# Patient Record
Sex: Male | Born: 1935 | Race: White | Hispanic: No | Marital: Married
Health system: Southern US, Community
[De-identification: ages and names within clinical notes are randomized; demographics above are authoritative.]

---

## 2000-11-04 ENCOUNTER — Other Ambulatory Visit: Admission: RE | Admit: 2000-11-04 | Discharge: 2000-11-04 | Payer: Self-pay | Admitting: Gastroenterology

## 2004-03-13 ENCOUNTER — Ambulatory Visit (HOSPITAL_COMMUNITY): Admission: RE | Admit: 2004-03-13 | Discharge: 2004-03-14 | Payer: Self-pay | Admitting: Ophthalmology

## 2005-08-06 ENCOUNTER — Other Ambulatory Visit: Payer: Self-pay

## 2005-08-06 ENCOUNTER — Inpatient Hospital Stay: Payer: Self-pay | Admitting: Internal Medicine

## 2007-03-11 ENCOUNTER — Encounter: Payer: Self-pay | Admitting: Internal Medicine

## 2007-04-01 ENCOUNTER — Encounter: Payer: Self-pay | Admitting: Internal Medicine

## 2007-11-17 ENCOUNTER — Ambulatory Visit: Payer: Self-pay | Admitting: Gastroenterology

## 2011-06-06 ENCOUNTER — Inpatient Hospital Stay: Payer: Self-pay | Admitting: Specialist

## 2011-08-21 ENCOUNTER — Ambulatory Visit: Payer: Self-pay | Admitting: Family Medicine

## 2011-09-10 ENCOUNTER — Inpatient Hospital Stay: Payer: Self-pay | Admitting: Internal Medicine

## 2011-09-10 LAB — BASIC METABOLIC PANEL
BUN: 70 mg/dL — ABNORMAL HIGH (ref 7–18)
Calcium, Total: 8.8 mg/dL (ref 8.5–10.1)
Creatinine: 5.03 mg/dL — ABNORMAL HIGH (ref 0.60–1.30)
EGFR (African American): 15 — ABNORMAL LOW
EGFR (Non-African Amer.): 12 — ABNORMAL LOW
Glucose: 43 mg/dL — ABNORMAL LOW (ref 65–99)
Potassium: 4.8 mmol/L (ref 3.5–5.1)
Sodium: 143 mmol/L (ref 136–145)

## 2011-09-10 LAB — CBC
MCV: 89 fL (ref 80–100)
Platelet: 117 10*3/uL — ABNORMAL LOW (ref 150–440)
RBC: 3.02 10*6/uL — ABNORMAL LOW (ref 4.40–5.90)
WBC: 6.3 10*3/uL (ref 3.8–10.6)

## 2011-09-10 LAB — TROPONIN I: Troponin-I: 0.19 ng/mL — ABNORMAL HIGH

## 2011-09-10 LAB — PROTIME-INR: Prothrombin Time: 22.9 secs — ABNORMAL HIGH (ref 11.5–14.7)

## 2011-09-10 LAB — CK TOTAL AND CKMB (NOT AT ARMC)
CK, Total: 291 U/L — ABNORMAL HIGH (ref 35–232)
CK-MB: 6.2 ng/mL — ABNORMAL HIGH (ref 0.5–3.6)

## 2011-09-11 LAB — URINALYSIS, COMPLETE
Bilirubin,UR: NEGATIVE
Glucose,UR: 50 mg/dL (ref 0–75)
Ketone: NEGATIVE
RBC,UR: 19 /HPF (ref 0–5)
Specific Gravity: 1.014 (ref 1.003–1.030)
Squamous Epithelial: NONE SEEN
WBC UR: 7 /HPF (ref 0–5)

## 2011-09-11 LAB — FOLATE: Folic Acid: 31.6 ng/mL (ref 3.1–100.0)

## 2011-09-11 LAB — CK TOTAL AND CKMB (NOT AT ARMC)
CK, Total: 199 U/L (ref 35–232)
CK-MB: 4.1 ng/mL — ABNORMAL HIGH (ref 0.5–3.6)
CK-MB: 5 ng/mL — ABNORMAL HIGH (ref 0.5–3.6)

## 2011-09-11 LAB — CBC WITH DIFFERENTIAL/PLATELET
Basophil %: 0.1 %
Eosinophil %: 3.3 %
HCT: 24.5 % — ABNORMAL LOW (ref 40.0–52.0)
HGB: 7.9 g/dL — ABNORMAL LOW (ref 13.0–18.0)
MCH: 28.9 pg (ref 26.0–34.0)
MCHC: 32.2 g/dL (ref 32.0–36.0)
Neutrophil #: 3.4 10*3/uL (ref 1.4–6.5)
RBC: 2.73 10*6/uL — ABNORMAL LOW (ref 4.40–5.90)

## 2011-09-11 LAB — BASIC METABOLIC PANEL
Anion Gap: 10 (ref 7–16)
Chloride: 110 mmol/L — ABNORMAL HIGH (ref 98–107)
Co2: 24 mmol/L (ref 21–32)
Creatinine: 4.9 mg/dL — ABNORMAL HIGH (ref 0.60–1.30)

## 2011-09-11 LAB — IRON: Iron: 20 ug/dL — ABNORMAL LOW (ref 65–175)

## 2011-09-11 LAB — HEMOGLOBIN A1C: Hemoglobin A1C: 6 % (ref 4.2–6.3)

## 2011-09-11 LAB — TROPONIN I: Troponin-I: 0.16 ng/mL — ABNORMAL HIGH

## 2011-09-11 LAB — FERRITIN: Ferritin (ARMC): 103 ng/mL (ref 8–388)

## 2011-09-12 LAB — BASIC METABOLIC PANEL
Anion Gap: 12 (ref 7–16)
Calcium, Total: 8.4 mg/dL — ABNORMAL LOW (ref 8.5–10.1)
Co2: 23 mmol/L (ref 21–32)
EGFR (African American): 14 — ABNORMAL LOW
EGFR (Non-African Amer.): 12 — ABNORMAL LOW
Sodium: 142 mmol/L (ref 136–145)

## 2011-09-12 LAB — CBC WITH DIFFERENTIAL/PLATELET
Eosinophil %: 4.2 %
Lymphocyte #: 0.7 10*3/uL — ABNORMAL LOW (ref 1.0–3.6)
MCH: 28.4 pg (ref 26.0–34.0)
MCV: 90 fL (ref 80–100)
Neutrophil #: 5 10*3/uL (ref 1.4–6.5)
Platelet: 134 10*3/uL — ABNORMAL LOW (ref 150–440)
RBC: 3.28 10*6/uL — ABNORMAL LOW (ref 4.40–5.90)

## 2011-09-12 LAB — PHOSPHORUS: Phosphorus: 5.7 mg/dL — ABNORMAL HIGH (ref 2.5–4.9)

## 2011-10-21 ENCOUNTER — Ambulatory Visit: Payer: Self-pay | Admitting: Vascular Surgery

## 2011-10-21 LAB — CBC
MCHC: 31.8 g/dL — ABNORMAL LOW (ref 32.0–36.0)
MCV: 88 fL (ref 80–100)
RBC: 2.98 10*6/uL — ABNORMAL LOW (ref 4.40–5.90)

## 2011-10-21 LAB — BASIC METABOLIC PANEL
Anion Gap: 13 (ref 7–16)
BUN: 132 mg/dL — ABNORMAL HIGH (ref 7–18)
Calcium, Total: 9.3 mg/dL (ref 8.5–10.1)
Chloride: 98 mmol/L (ref 98–107)
Co2: 27 mmol/L (ref 21–32)
Creatinine: 9.01 mg/dL — ABNORMAL HIGH (ref 0.60–1.30)
Glucose: 123 mg/dL — ABNORMAL HIGH (ref 65–99)
Osmolality: 320 (ref 275–301)
Potassium: 4.3 mmol/L (ref 3.5–5.1)
Sodium: 138 mmol/L (ref 136–145)

## 2011-10-25 ENCOUNTER — Ambulatory Visit: Payer: Self-pay | Admitting: Vascular Surgery

## 2011-10-25 LAB — PROTIME-INR
INR: 2.1
Prothrombin Time: 23.5 secs — ABNORMAL HIGH (ref 11.5–14.7)

## 2011-11-11 ENCOUNTER — Ambulatory Visit: Payer: Self-pay | Admitting: Oncology

## 2011-11-11 LAB — CBC CANCER CENTER
Basophil %: 1 %
Eosinophil #: 0.1 x10 3/mm (ref 0.0–0.7)
Eosinophil %: 1.3 %
HGB: 7.1 g/dL — ABNORMAL LOW (ref 13.0–18.0)
Lymphocyte %: 7.9 %
MCH: 28.1 pg (ref 26.0–34.0)
MCHC: 30.9 g/dL — ABNORMAL LOW (ref 32.0–36.0)
Monocyte #: 0.5 x10 3/mm (ref 0.2–1.0)
Neutrophil #: 4 x10 3/mm (ref 1.4–6.5)
Neutrophil %: 80.5 %
WBC: 4.9 x10 3/mm (ref 3.8–10.6)

## 2011-11-11 LAB — LACTATE DEHYDROGENASE: LDH: 274 U/L — ABNORMAL HIGH (ref 87–241)

## 2011-11-11 LAB — IRON AND TIBC
Iron Bind.Cap.(Total): 312 ug/dL (ref 250–450)
Iron: 42 ug/dL — ABNORMAL LOW (ref 65–175)
Unbound Iron-Bind.Cap.: 270 ug/dL

## 2011-11-30 ENCOUNTER — Ambulatory Visit: Payer: Self-pay | Admitting: Oncology

## 2011-12-06 ENCOUNTER — Encounter: Payer: Self-pay | Admitting: Cardiothoracic Surgery

## 2011-12-06 ENCOUNTER — Encounter: Payer: Self-pay | Admitting: Nurse Practitioner

## 2011-12-11 LAB — WOUND CULTURE

## 2011-12-30 ENCOUNTER — Encounter: Payer: Self-pay | Admitting: Nurse Practitioner

## 2011-12-30 ENCOUNTER — Encounter: Payer: Self-pay | Admitting: Cardiothoracic Surgery

## 2012-01-29 ENCOUNTER — Emergency Department: Payer: Self-pay

## 2012-01-29 LAB — CBC
MCH: 30.3 pg (ref 26.0–34.0)
MCHC: 33.3 g/dL (ref 32.0–36.0)
Platelet: 168 10*3/uL (ref 150–440)
RBC: 3.46 10*6/uL — ABNORMAL LOW (ref 4.40–5.90)

## 2012-01-29 LAB — URINALYSIS, COMPLETE
Bilirubin,UR: NEGATIVE
Glucose,UR: 50 mg/dL (ref 0–75)
Nitrite: NEGATIVE
Protein: 100
RBC,UR: 16174 /HPF (ref 0–5)
WBC UR: 59 /HPF (ref 0–5)

## 2012-01-29 LAB — BASIC METABOLIC PANEL
Anion Gap: 10 (ref 7–16)
BUN: 81 mg/dL — ABNORMAL HIGH (ref 7–18)
Creatinine: 4.98 mg/dL — ABNORMAL HIGH (ref 0.60–1.30)
EGFR (African American): 12 — ABNORMAL LOW
EGFR (Non-African Amer.): 10 — ABNORMAL LOW
Glucose: 87 mg/dL (ref 65–99)
Sodium: 141 mmol/L (ref 136–145)

## 2012-01-30 ENCOUNTER — Encounter: Payer: Self-pay | Admitting: Nurse Practitioner

## 2012-02-02 LAB — URINE CULTURE

## 2012-02-03 ENCOUNTER — Ambulatory Visit: Payer: Self-pay | Admitting: Oncology

## 2012-02-03 LAB — CANCER CENTER HEMOGLOBIN: HGB: 9.8 g/dL — ABNORMAL LOW (ref 13.0–18.0)

## 2012-02-05 ENCOUNTER — Encounter: Payer: Self-pay | Admitting: Cardiothoracic Surgery

## 2012-03-01 ENCOUNTER — Encounter: Payer: Self-pay | Admitting: Cardiothoracic Surgery

## 2012-03-01 ENCOUNTER — Ambulatory Visit: Payer: Self-pay | Admitting: Oncology

## 2012-03-01 ENCOUNTER — Encounter: Payer: Self-pay | Admitting: Nurse Practitioner

## 2012-05-01 ENCOUNTER — Ambulatory Visit: Payer: Self-pay | Admitting: Gastroenterology

## 2012-05-01 LAB — POTASSIUM: Potassium: 3.2 mmol/L — ABNORMAL LOW (ref 3.5–5.1)

## 2012-05-05 ENCOUNTER — Ambulatory Visit: Payer: Self-pay | Admitting: Specialist

## 2012-05-05 LAB — PATHOLOGY REPORT

## 2012-05-25 ENCOUNTER — Ambulatory Visit: Payer: Self-pay | Admitting: Specialist

## 2012-06-12 ENCOUNTER — Ambulatory Visit: Payer: Self-pay | Admitting: Internal Medicine

## 2012-07-13 ENCOUNTER — Inpatient Hospital Stay: Payer: Self-pay | Admitting: Internal Medicine

## 2012-07-13 LAB — COMPREHENSIVE METABOLIC PANEL
Albumin: 2.8 g/dL — ABNORMAL LOW (ref 3.4–5.0)
Alkaline Phosphatase: 97 U/L (ref 50–136)
Chloride: 94 mmol/L — ABNORMAL LOW (ref 98–107)
Co2: 26 mmol/L (ref 21–32)
EGFR (African American): 5 — ABNORMAL LOW
Glucose: 199 mg/dL — ABNORMAL HIGH (ref 65–99)
SGOT(AST): 12 U/L — ABNORMAL LOW (ref 15–37)
SGPT (ALT): 16 U/L (ref 12–78)
Total Protein: 7.8 g/dL (ref 6.4–8.2)

## 2012-07-13 LAB — CBC
MCHC: 33 g/dL (ref 32.0–36.0)
MCV: 95 fL (ref 80–100)
Platelet: 183 10*3/uL (ref 150–440)
RBC: 3.38 10*6/uL — ABNORMAL LOW (ref 4.40–5.90)
RDW: 18.5 % — ABNORMAL HIGH (ref 11.5–14.5)
WBC: 9.2 10*3/uL (ref 3.8–10.6)

## 2012-07-13 LAB — PRO B NATRIURETIC PEPTIDE: B-Type Natriuretic Peptide: 32467 pg/mL — ABNORMAL HIGH (ref 0–450)

## 2012-07-13 LAB — TROPONIN I: Troponin-I: 0.15 ng/mL — ABNORMAL HIGH

## 2012-07-13 LAB — PROTIME-INR
INR: 2.1
Prothrombin Time: 23.7 secs — ABNORMAL HIGH (ref 11.5–14.7)

## 2012-07-13 LAB — LIPASE, BLOOD: Lipase: 258 U/L (ref 73–393)

## 2012-07-13 LAB — RAPID INFLUENZA A&B ANTIGENS

## 2012-07-14 LAB — BODY FLUID CELL COUNT WITH DIFFERENTIAL
Eosinophil: 0 %
Neutrophils: 98 %
Nucleated Cell Count: 14886 /mm3

## 2012-07-14 LAB — TROPONIN I: Troponin-I: 0.16 ng/mL — ABNORMAL HIGH

## 2012-07-15 LAB — PROTIME-INR: Prothrombin Time: 30.3 secs — ABNORMAL HIGH (ref 11.5–14.7)

## 2012-07-16 LAB — BODY FLUID CELL COUNT WITH DIFFERENTIAL
Basophil: 0 %
Eosinophil: 0 %
Neutrophils: 94 %
Nucleated Cell Count: 1069 /mm3
Other Cells BF: 0 %
Other Mononuclear Cells: 2 %

## 2012-07-16 LAB — VANCOMYCIN, RANDOM: Vancomycin, Random: 11 ug/mL

## 2012-07-16 LAB — PROTIME-INR
INR: 3.3
Prothrombin Time: 33.3 secs — ABNORMAL HIGH (ref 11.5–14.7)

## 2012-07-17 LAB — CBC WITH DIFFERENTIAL/PLATELET
Basophil %: 0.4 %
Eosinophil #: 0.4 10*3/uL (ref 0.0–0.7)
Eosinophil %: 4.6 %
HCT: 27.9 % — ABNORMAL LOW (ref 40.0–52.0)
Lymphocyte %: 4.2 %
MCHC: 33.9 g/dL (ref 32.0–36.0)
Monocyte %: 6.2 %
Platelet: 136 10*3/uL — ABNORMAL LOW (ref 150–440)
RDW: 17.9 % — ABNORMAL HIGH (ref 11.5–14.5)

## 2012-07-17 LAB — PROTIME-INR
INR: 3.6
Prothrombin Time: 35.6 secs — ABNORMAL HIGH (ref 11.5–14.7)

## 2012-07-17 LAB — COMPREHENSIVE METABOLIC PANEL
Albumin: 2 g/dL — ABNORMAL LOW (ref 3.4–5.0)
Alkaline Phosphatase: 105 U/L (ref 50–136)
Chloride: 88 mmol/L — ABNORMAL LOW (ref 98–107)
Co2: 24 mmol/L (ref 21–32)
EGFR (African American): 8 — ABNORMAL LOW
Glucose: 204 mg/dL — ABNORMAL HIGH (ref 65–99)
Osmolality: 282 (ref 275–301)
Potassium: 3.6 mmol/L (ref 3.5–5.1)
SGOT(AST): 17 U/L (ref 15–37)
Sodium: 127 mmol/L — ABNORMAL LOW (ref 136–145)

## 2012-07-18 LAB — CBC WITH DIFFERENTIAL/PLATELET
Basophil #: 0 10*3/uL (ref 0.0–0.1)
Basophil %: 0.3 %
Eosinophil #: 0.3 10*3/uL (ref 0.0–0.7)
Eosinophil %: 3.4 %
Lymphocyte #: 0.2 10*3/uL — ABNORMAL LOW (ref 1.0–3.6)
MCH: 31.9 pg (ref 26.0–34.0)
MCV: 94 fL (ref 80–100)
Monocyte #: 0.4 x10 3/mm (ref 0.2–1.0)
Neutrophil #: 6.7 10*3/uL — ABNORMAL HIGH (ref 1.4–6.5)
Neutrophil %: 88 %
Platelet: 130 10*3/uL — ABNORMAL LOW (ref 150–440)
RDW: 17.7 % — ABNORMAL HIGH (ref 11.5–14.5)
WBC: 7.6 10*3/uL (ref 3.8–10.6)

## 2012-07-18 LAB — COMPREHENSIVE METABOLIC PANEL
Albumin: 1.9 g/dL — ABNORMAL LOW (ref 3.4–5.0)
Alkaline Phosphatase: 108 U/L (ref 50–136)
BUN: 58 mg/dL — ABNORMAL HIGH (ref 7–18)
Bilirubin,Total: 0.6 mg/dL (ref 0.2–1.0)
Calcium, Total: 8.7 mg/dL (ref 8.5–10.1)
Chloride: 91 mmol/L — ABNORMAL LOW (ref 98–107)
Co2: 25 mmol/L (ref 21–32)
EGFR (African American): 8 — ABNORMAL LOW
Glucose: 193 mg/dL — ABNORMAL HIGH (ref 65–99)
Potassium: 3.3 mmol/L — ABNORMAL LOW (ref 3.5–5.1)
SGOT(AST): 16 U/L (ref 15–37)
SGPT (ALT): 17 U/L (ref 12–78)
Sodium: 128 mmol/L — ABNORMAL LOW (ref 136–145)
Total Protein: 6.1 g/dL — ABNORMAL LOW (ref 6.4–8.2)

## 2012-07-18 LAB — PROTIME-INR
INR: 3.6
Prothrombin Time: 35.6 secs — ABNORMAL HIGH (ref 11.5–14.7)

## 2012-07-18 LAB — BODY FLUID CELL COUNT WITH DIFFERENTIAL
Lymphocytes: 6 %
Nucleated Cell Count: 10242 /mm3
Other Cells BF: 0 %
Other Mononuclear Cells: 10 %

## 2012-07-18 LAB — BODY FLUID CULTURE

## 2012-07-19 LAB — PROTIME-INR
INR: 3
Prothrombin Time: 30.9 secs — ABNORMAL HIGH (ref 11.5–14.7)

## 2012-07-19 LAB — CULTURE, BLOOD (SINGLE)

## 2012-07-20 LAB — BASIC METABOLIC PANEL
Anion Gap: 11 (ref 7–16)
Calcium, Total: 8.6 mg/dL (ref 8.5–10.1)
Co2: 26 mmol/L (ref 21–32)
Creatinine: 6.84 mg/dL — ABNORMAL HIGH (ref 0.60–1.30)
EGFR (African American): 8 — ABNORMAL LOW
Glucose: 256 mg/dL — ABNORMAL HIGH (ref 65–99)
Osmolality: 281 (ref 275–301)
Sodium: 128 mmol/L — ABNORMAL LOW (ref 136–145)

## 2012-07-20 LAB — PROTIME-INR: Prothrombin Time: 19.5 secs — ABNORMAL HIGH (ref 11.5–14.7)

## 2012-07-20 LAB — BODY FLUID CULTURE

## 2012-07-21 LAB — CBC WITH DIFFERENTIAL/PLATELET
Basophil %: 0.5 %
Eosinophil %: 3.8 %
HCT: 25.5 % — ABNORMAL LOW (ref 40.0–52.0)
Lymphocyte %: 2.7 %
MCH: 32.2 pg (ref 26.0–34.0)
MCHC: 34.4 g/dL (ref 32.0–36.0)
MCV: 94 fL (ref 80–100)
Monocyte #: 0.7 x10 3/mm (ref 0.2–1.0)
Monocyte %: 7.3 %
Neutrophil #: 8.1 10*3/uL — ABNORMAL HIGH (ref 1.4–6.5)
Platelet: 161 10*3/uL (ref 150–440)
RBC: 2.73 10*6/uL — ABNORMAL LOW (ref 4.40–5.90)
RDW: 17.5 % — ABNORMAL HIGH (ref 11.5–14.5)
WBC: 9.4 10*3/uL (ref 3.8–10.6)

## 2012-07-21 LAB — COMPREHENSIVE METABOLIC PANEL
Albumin: 1.5 g/dL — ABNORMAL LOW (ref 3.4–5.0)
Alkaline Phosphatase: 88 U/L (ref 50–136)
Anion Gap: 8 (ref 7–16)
BUN: 49 mg/dL — ABNORMAL HIGH (ref 7–18)
Bilirubin,Total: 0.4 mg/dL (ref 0.2–1.0)
Chloride: 91 mmol/L — ABNORMAL LOW (ref 98–107)
Co2: 28 mmol/L (ref 21–32)
Creatinine: 6.68 mg/dL — ABNORMAL HIGH (ref 0.60–1.30)
Potassium: 3.5 mmol/L (ref 3.5–5.1)
SGOT(AST): 14 U/L — ABNORMAL LOW (ref 15–37)
SGPT (ALT): 14 U/L (ref 12–78)
Total Protein: 5.2 g/dL — ABNORMAL LOW (ref 6.4–8.2)

## 2012-07-22 LAB — PROTIME-INR
INR: 1.5
Prothrombin Time: 18.1 secs — ABNORMAL HIGH (ref 11.5–14.7)

## 2012-07-23 LAB — RENAL FUNCTION PANEL
Albumin: 1.5 g/dL — ABNORMAL LOW (ref 3.4–5.0)
Anion Gap: 10 (ref 7–16)
BUN: 63 mg/dL — ABNORMAL HIGH (ref 7–18)
Calcium, Total: 8.3 mg/dL — ABNORMAL LOW (ref 8.5–10.1)
Chloride: 91 mmol/L — ABNORMAL LOW (ref 98–107)
Co2: 26 mmol/L (ref 21–32)
Creatinine: 8.76 mg/dL — ABNORMAL HIGH (ref 0.60–1.30)
EGFR (African American): 6 — ABNORMAL LOW
EGFR (Non-African Amer.): 5 — ABNORMAL LOW
Potassium: 4 mmol/L (ref 3.5–5.1)
Sodium: 127 mmol/L — ABNORMAL LOW (ref 136–145)

## 2012-07-23 LAB — PHOSPHORUS: Phosphorus: 5.3 mg/dL — ABNORMAL HIGH (ref 2.5–4.9)

## 2012-07-24 LAB — PROTIME-INR
INR: 1.3
Prothrombin Time: 16.5 secs — ABNORMAL HIGH (ref 11.5–14.7)

## 2012-07-25 LAB — CBC WITH DIFFERENTIAL/PLATELET
Basophil %: 0.6 %
HGB: 7.6 g/dL — ABNORMAL LOW (ref 13.0–18.0)
Lymphocyte #: 0.5 10*3/uL — ABNORMAL LOW (ref 1.0–3.6)
Lymphocyte %: 3.8 %
MCHC: 31.7 g/dL — ABNORMAL LOW (ref 32.0–36.0)
MCV: 95 fL (ref 80–100)
Neutrophil #: 10.5 10*3/uL — ABNORMAL HIGH (ref 1.4–6.5)
Neutrophil %: 81.1 %
Platelet: 188 10*3/uL (ref 150–440)
RDW: 17.2 % — ABNORMAL HIGH (ref 11.5–14.5)
WBC: 12.9 10*3/uL — ABNORMAL HIGH (ref 3.8–10.6)

## 2012-07-25 LAB — COMPREHENSIVE METABOLIC PANEL
Albumin: 1.7 g/dL — ABNORMAL LOW (ref 3.4–5.0)
Anion Gap: 11 (ref 7–16)
Calcium, Total: 8.2 mg/dL — ABNORMAL LOW (ref 8.5–10.1)
Creatinine: 6.74 mg/dL — ABNORMAL HIGH (ref 0.60–1.30)
EGFR (African American): 8 — ABNORMAL LOW
Osmolality: 279 (ref 275–301)
Potassium: 4 mmol/L (ref 3.5–5.1)
SGPT (ALT): 11 U/L — ABNORMAL LOW (ref 12–78)

## 2012-07-25 LAB — PHOSPHORUS: Phosphorus: 3.6 mg/dL (ref 2.5–4.9)

## 2012-07-26 LAB — PROTIME-INR: INR: 1.3

## 2012-07-27 LAB — RENAL FUNCTION PANEL
Anion Gap: 9 (ref 7–16)
Calcium, Total: 8.5 mg/dL (ref 8.5–10.1)
Chloride: 95 mmol/L — ABNORMAL LOW (ref 98–107)
EGFR (African American): 9 — ABNORMAL LOW
EGFR (Non-African Amer.): 8 — ABNORMAL LOW
Glucose: 212 mg/dL — ABNORMAL HIGH (ref 65–99)
Osmolality: 283 (ref 275–301)
Potassium: 4.2 mmol/L (ref 3.5–5.1)

## 2012-07-27 LAB — CBC WITH DIFFERENTIAL/PLATELET
Basophil #: 0.1 10*3/uL (ref 0.0–0.1)
Basophil %: 0.9 %
Eosinophil #: 0.6 10*3/uL (ref 0.0–0.7)
Eosinophil %: 5 %
HGB: 7.4 g/dL — ABNORMAL LOW (ref 13.0–18.0)
Lymphocyte #: 0.5 10*3/uL — ABNORMAL LOW (ref 1.0–3.6)
Lymphocyte %: 4.1 %
MCH: 30.6 pg (ref 26.0–34.0)
MCHC: 31.8 g/dL — ABNORMAL LOW (ref 32.0–36.0)
Monocyte #: 0.8 x10 3/mm (ref 0.2–1.0)
Monocyte %: 6.3 %
Neutrophil #: 10.3 10*3/uL — ABNORMAL HIGH (ref 1.4–6.5)
Neutrophil %: 83.7 %
Platelet: 191 10*3/uL (ref 150–440)
RBC: 2.42 10*6/uL — ABNORMAL LOW (ref 4.40–5.90)

## 2012-07-27 LAB — PROTIME-INR: INR: 1.4

## 2012-07-28 LAB — CULTURE, BLOOD (SINGLE)

## 2012-08-18 ENCOUNTER — Ambulatory Visit: Payer: Self-pay | Admitting: Vascular Surgery

## 2012-08-18 LAB — BASIC METABOLIC PANEL
Anion Gap: 8 (ref 7–16)
BUN: 32 mg/dL — ABNORMAL HIGH (ref 7–18)
Calcium, Total: 9.2 mg/dL (ref 8.5–10.1)
Co2: 30 mmol/L (ref 21–32)
Creatinine: 4.01 mg/dL — ABNORMAL HIGH (ref 0.60–1.30)
EGFR (Non-African Amer.): 13 — ABNORMAL LOW
Glucose: 136 mg/dL — ABNORMAL HIGH (ref 65–99)
Sodium: 141 mmol/L (ref 136–145)

## 2012-08-18 LAB — CBC
HGB: 8.2 g/dL — ABNORMAL LOW (ref 13.0–18.0)
MCH: 29.2 pg (ref 26.0–34.0)
MCHC: 31.2 g/dL — ABNORMAL LOW (ref 32.0–36.0)
MCV: 94 fL (ref 80–100)
RDW: 18.9 % — ABNORMAL HIGH (ref 11.5–14.5)

## 2012-08-21 ENCOUNTER — Encounter: Payer: Self-pay | Admitting: Cardiothoracic Surgery

## 2012-08-21 ENCOUNTER — Encounter: Payer: Self-pay | Admitting: Nurse Practitioner

## 2012-08-27 ENCOUNTER — Ambulatory Visit: Payer: Self-pay | Admitting: Vascular Surgery

## 2012-08-27 LAB — PROTIME-INR
INR: 1.5
Prothrombin Time: 17.8 secs — ABNORMAL HIGH (ref 11.5–14.7)

## 2012-08-29 ENCOUNTER — Encounter: Payer: Self-pay | Admitting: Nurse Practitioner

## 2012-08-29 ENCOUNTER — Encounter: Payer: Self-pay | Admitting: Cardiothoracic Surgery

## 2012-09-15 ENCOUNTER — Ambulatory Visit: Payer: Self-pay | Admitting: Specialist

## 2012-09-24 ENCOUNTER — Ambulatory Visit: Payer: Self-pay | Admitting: Vascular Surgery

## 2012-09-24 LAB — PROTIME-INR: INR: 1.8

## 2012-09-29 ENCOUNTER — Encounter: Payer: Self-pay | Admitting: Nurse Practitioner

## 2012-09-29 ENCOUNTER — Ambulatory Visit: Payer: Self-pay | Admitting: Specialist

## 2012-10-01 ENCOUNTER — Encounter: Payer: Self-pay | Admitting: Cardiothoracic Surgery

## 2012-10-06 ENCOUNTER — Ambulatory Visit: Payer: Self-pay | Admitting: Vascular Surgery

## 2012-10-06 LAB — BASIC METABOLIC PANEL
Anion Gap: 6 — ABNORMAL LOW (ref 7–16)
BUN: 42 mg/dL — ABNORMAL HIGH (ref 7–18)
Calcium, Total: 9.4 mg/dL (ref 8.5–10.1)
Co2: 31 mmol/L (ref 21–32)
EGFR (African American): 15 — ABNORMAL LOW
Glucose: 143 mg/dL — ABNORMAL HIGH (ref 65–99)
Osmolality: 287 (ref 275–301)
Potassium: 3.3 mmol/L — ABNORMAL LOW (ref 3.5–5.1)

## 2012-10-06 LAB — CBC
HCT: 30.7 % — ABNORMAL LOW (ref 40.0–52.0)
HGB: 9.6 g/dL — ABNORMAL LOW (ref 13.0–18.0)
MCV: 87 fL (ref 80–100)
RDW: 20.2 % — ABNORMAL HIGH (ref 11.5–14.5)
WBC: 5.3 10*3/uL (ref 3.8–10.6)

## 2012-10-08 ENCOUNTER — Ambulatory Visit: Payer: Self-pay | Admitting: Vascular Surgery

## 2012-10-08 LAB — PROTIME-INR: Prothrombin Time: 22.6 secs — ABNORMAL HIGH (ref 11.5–14.7)

## 2012-10-08 LAB — POTASSIUM: Potassium: 3.3 mmol/L — ABNORMAL LOW (ref 3.5–5.1)

## 2012-10-15 ENCOUNTER — Ambulatory Visit: Payer: Self-pay | Admitting: Vascular Surgery

## 2012-10-15 LAB — BASIC METABOLIC PANEL
Calcium, Total: 9.4 mg/dL (ref 8.5–10.1)
Chloride: 99 mmol/L (ref 98–107)
Co2: 31 mmol/L (ref 21–32)
Glucose: 94 mg/dL (ref 65–99)
Osmolality: 283 (ref 275–301)

## 2012-10-15 LAB — PROTIME-INR: Prothrombin Time: 19.3 secs — ABNORMAL HIGH (ref 11.5–14.7)

## 2012-10-22 ENCOUNTER — Ambulatory Visit: Payer: Self-pay | Admitting: Vascular Surgery

## 2012-10-22 LAB — POTASSIUM: Potassium: 3.2 mmol/L — ABNORMAL LOW (ref 3.5–5.1)

## 2012-10-22 LAB — PROTIME-INR: INR: 1.7

## 2012-10-29 ENCOUNTER — Encounter: Payer: Self-pay | Admitting: Nurse Practitioner

## 2012-10-29 ENCOUNTER — Ambulatory Visit: Payer: Self-pay | Admitting: Vascular Surgery

## 2012-10-29 LAB — BASIC METABOLIC PANEL
Anion Gap: 7 (ref 7–16)
BUN: 33 mg/dL — ABNORMAL HIGH (ref 7–18)
Calcium, Total: 10 mg/dL (ref 8.5–10.1)
Creatinine: 3.91 mg/dL — ABNORMAL HIGH (ref 0.60–1.30)
EGFR (African American): 16 — ABNORMAL LOW
EGFR (Non-African Amer.): 14 — ABNORMAL LOW
Osmolality: 279 (ref 275–301)
Potassium: 3.7 mmol/L (ref 3.5–5.1)
Sodium: 136 mmol/L (ref 136–145)

## 2012-10-29 LAB — PROTIME-INR: INR: 1.4

## 2012-10-30 LAB — CBC WITH DIFFERENTIAL/PLATELET
Basophil #: 0 10*3/uL (ref 0.0–0.1)
Basophil %: 0.7 %
Eosinophil #: 0.3 10*3/uL (ref 0.0–0.7)
HGB: 8.3 g/dL — ABNORMAL LOW (ref 13.0–18.0)
Lymphocyte #: 0.4 10*3/uL — ABNORMAL LOW (ref 1.0–3.6)
MCH: 27 pg (ref 26.0–34.0)
MCHC: 31.4 g/dL — ABNORMAL LOW (ref 32.0–36.0)
MCV: 86 fL (ref 80–100)
Monocyte #: 0.5 x10 3/mm (ref 0.2–1.0)
Monocyte %: 10.8 %
Neutrophil #: 3.2 10*3/uL (ref 1.4–6.5)
Neutrophil %: 72.4 %
RBC: 3.08 10*6/uL — ABNORMAL LOW (ref 4.40–5.90)
RDW: 20.2 % — ABNORMAL HIGH (ref 11.5–14.5)

## 2012-11-04 ENCOUNTER — Encounter: Payer: Self-pay | Admitting: Cardiothoracic Surgery

## 2012-11-20 ENCOUNTER — Ambulatory Visit: Payer: Self-pay | Admitting: Vascular Surgery

## 2012-11-20 LAB — CBC
MCH: 27 pg (ref 26.0–34.0)
MCHC: 32.2 g/dL (ref 32.0–36.0)
MCV: 84 fL (ref 80–100)
RBC: 3.36 10*6/uL — ABNORMAL LOW (ref 4.40–5.90)
RDW: 19.6 % — ABNORMAL HIGH (ref 11.5–14.5)

## 2012-11-20 LAB — PROTIME-INR
INR: 1.4
Prothrombin Time: 17.2 secs — ABNORMAL HIGH (ref 11.5–14.7)

## 2012-11-20 LAB — BASIC METABOLIC PANEL
Anion Gap: 7 (ref 7–16)
Calcium, Total: 9.5 mg/dL (ref 8.5–10.1)
Chloride: 97 mmol/L — ABNORMAL LOW (ref 98–107)
Co2: 30 mmol/L (ref 21–32)
Creatinine: 3.33 mg/dL — ABNORMAL HIGH (ref 0.60–1.30)
EGFR (African American): 20 — ABNORMAL LOW
EGFR (Non-African Amer.): 17 — ABNORMAL LOW
Osmolality: 278 (ref 275–301)
Potassium: 3.6 mmol/L (ref 3.5–5.1)
Sodium: 134 mmol/L — ABNORMAL LOW (ref 136–145)

## 2012-11-29 ENCOUNTER — Encounter: Payer: Self-pay | Admitting: Nurse Practitioner

## 2012-12-02 ENCOUNTER — Encounter: Payer: Self-pay | Admitting: Cardiothoracic Surgery

## 2012-12-10 ENCOUNTER — Ambulatory Visit: Payer: Self-pay | Admitting: Specialist

## 2012-12-15 ENCOUNTER — Ambulatory Visit: Payer: Self-pay | Admitting: Vascular Surgery

## 2012-12-29 ENCOUNTER — Encounter: Payer: Self-pay | Admitting: Nurse Practitioner

## 2012-12-29 ENCOUNTER — Ambulatory Visit: Payer: Self-pay | Admitting: Vascular Surgery

## 2012-12-30 ENCOUNTER — Encounter: Payer: Self-pay | Admitting: Cardiothoracic Surgery

## 2013-02-02 ENCOUNTER — Ambulatory Visit: Payer: Self-pay | Admitting: Vascular Surgery

## 2013-02-02 LAB — BASIC METABOLIC PANEL
BUN: 61 mg/dL — ABNORMAL HIGH (ref 7–18)
Chloride: 100 mmol/L (ref 98–107)
Co2: 30 mmol/L (ref 21–32)
Creatinine: 4.54 mg/dL — ABNORMAL HIGH (ref 0.60–1.30)
EGFR (African American): 13 — ABNORMAL LOW
EGFR (Non-African Amer.): 12 — ABNORMAL LOW
Glucose: 168 mg/dL — ABNORMAL HIGH (ref 65–99)
Sodium: 136 mmol/L (ref 136–145)

## 2013-02-02 LAB — CBC
HCT: 31.6 % — ABNORMAL LOW (ref 40.0–52.0)
MCH: 28.4 pg (ref 26.0–34.0)
MCHC: 32.4 g/dL (ref 32.0–36.0)
MCV: 88 fL (ref 80–100)
Platelet: 150 10*3/uL (ref 150–440)
RBC: 3.61 10*6/uL — ABNORMAL LOW (ref 4.40–5.90)

## 2013-02-09 ENCOUNTER — Ambulatory Visit: Payer: Self-pay | Admitting: Vascular Surgery

## 2013-02-09 LAB — PROTIME-INR: Prothrombin Time: 18.1 secs — ABNORMAL HIGH (ref 11.5–14.7)

## 2013-02-12 ENCOUNTER — Inpatient Hospital Stay: Payer: Self-pay | Admitting: Vascular Surgery

## 2013-02-12 LAB — PROTIME-INR: Prothrombin Time: 15.5 secs — ABNORMAL HIGH (ref 11.5–14.7)

## 2013-02-12 LAB — POTASSIUM: Potassium: 3.9 mmol/L (ref 3.5–5.1)

## 2013-02-13 LAB — RENAL FUNCTION PANEL
Anion Gap: 11 (ref 7–16)
Calcium, Total: 9.2 mg/dL (ref 8.5–10.1)
Creatinine: 7.24 mg/dL — ABNORMAL HIGH (ref 0.60–1.30)
EGFR (African American): 8 — ABNORMAL LOW
EGFR (Non-African Amer.): 7 — ABNORMAL LOW
Glucose: 135 mg/dL — ABNORMAL HIGH (ref 65–99)
Potassium: 4.2 mmol/L (ref 3.5–5.1)

## 2013-05-31 ENCOUNTER — Inpatient Hospital Stay: Payer: Self-pay | Admitting: Surgery

## 2013-05-31 ENCOUNTER — Ambulatory Visit: Payer: Self-pay | Admitting: Internal Medicine

## 2013-05-31 LAB — COMPREHENSIVE METABOLIC PANEL
Albumin: 2.8 g/dL — ABNORMAL LOW (ref 3.4–5.0)
Alkaline Phosphatase: 131 U/L — ABNORMAL HIGH
Anion Gap: 10 (ref 7–16)
BUN: 65 mg/dL — ABNORMAL HIGH (ref 7–18)
Bilirubin,Total: 0.6 mg/dL (ref 0.2–1.0)
Co2: 27 mmol/L (ref 21–32)
Creatinine: 5.25 mg/dL — ABNORMAL HIGH (ref 0.60–1.30)
EGFR (Non-African Amer.): 10 — ABNORMAL LOW
Osmolality: 283 (ref 275–301)
SGOT(AST): 23 U/L (ref 15–37)
SGPT (ALT): 19 U/L (ref 12–78)
Sodium: 133 mmol/L — ABNORMAL LOW (ref 136–145)
Total Protein: 7.1 g/dL (ref 6.4–8.2)

## 2013-05-31 LAB — PROTIME-INR: Prothrombin Time: 23.4 secs — ABNORMAL HIGH (ref 11.5–14.7)

## 2013-05-31 LAB — CBC
MCV: 85 fL (ref 80–100)
Platelet: 142 10*3/uL — ABNORMAL LOW (ref 150–440)
RDW: 20.4 % — ABNORMAL HIGH (ref 11.5–14.5)

## 2013-05-31 LAB — LIPASE, BLOOD: Lipase: 252 U/L (ref 73–393)

## 2013-06-01 LAB — CBC WITH DIFFERENTIAL/PLATELET
Basophil #: 0 10*3/uL (ref 0.0–0.1)
Basophil %: 0.2 %
Eosinophil #: 0 10*3/uL (ref 0.0–0.7)
HCT: 31.3 % — ABNORMAL LOW (ref 40.0–52.0)
HGB: 10.1 g/dL — ABNORMAL LOW (ref 13.0–18.0)
Lymphocyte %: 3.6 %
MCHC: 32.1 g/dL (ref 32.0–36.0)
Monocyte %: 12.8 %
Platelet: 122 10*3/uL — ABNORMAL LOW (ref 150–440)
RBC: 3.7 10*6/uL — ABNORMAL LOW (ref 4.40–5.90)
RDW: 20.3 % — ABNORMAL HIGH (ref 11.5–14.5)
WBC: 8.2 10*3/uL (ref 3.8–10.6)

## 2013-06-01 LAB — PROTIME-INR: Prothrombin Time: 22.8 secs — ABNORMAL HIGH (ref 11.5–14.7)

## 2013-06-02 LAB — BASIC METABOLIC PANEL
BUN: 78 mg/dL — ABNORMAL HIGH (ref 7–18)
Co2: 29 mmol/L (ref 21–32)
Creatinine: 6.96 mg/dL — ABNORMAL HIGH (ref 0.60–1.30)
EGFR (African American): 8 — ABNORMAL LOW
Osmolality: 289 (ref 275–301)
Potassium: 4.3 mmol/L (ref 3.5–5.1)

## 2013-06-02 LAB — CBC WITH DIFFERENTIAL/PLATELET
Eosinophil #: 0.1 10*3/uL (ref 0.0–0.7)
Eosinophil %: 1.1 %
HGB: 10.1 g/dL — ABNORMAL LOW (ref 13.0–18.0)
Lymphocyte %: 4.9 %
MCH: 27 pg (ref 26.0–34.0)
MCHC: 31.8 g/dL — ABNORMAL LOW (ref 32.0–36.0)
MCV: 85 fL (ref 80–100)
Monocyte #: 0.8 x10 3/mm (ref 0.2–1.0)
Monocyte %: 10.4 %
Neutrophil %: 83.3 %
Platelet: 125 10*3/uL — ABNORMAL LOW (ref 150–440)
RBC: 3.72 10*6/uL — ABNORMAL LOW (ref 4.40–5.90)
RDW: 20.3 % — ABNORMAL HIGH (ref 11.5–14.5)
WBC: 7.6 10*3/uL (ref 3.8–10.6)

## 2013-06-02 LAB — PROTIME-INR: Prothrombin Time: 20.5 secs — ABNORMAL HIGH (ref 11.5–14.7)

## 2013-06-03 LAB — COMPREHENSIVE METABOLIC PANEL
Anion Gap: 7 (ref 7–16)
BUN: 37 mg/dL — ABNORMAL HIGH (ref 7–18)
Creatinine: 4.44 mg/dL — ABNORMAL HIGH (ref 0.60–1.30)
EGFR (African American): 14 — ABNORMAL LOW
EGFR (Non-African Amer.): 12 — ABNORMAL LOW
Glucose: 111 mg/dL — ABNORMAL HIGH (ref 65–99)
Osmolality: 283 (ref 275–301)
Potassium: 3.8 mmol/L (ref 3.5–5.1)
SGOT(AST): 11 U/L — ABNORMAL LOW (ref 15–37)
SGPT (ALT): 12 U/L (ref 12–78)
Sodium: 137 mmol/L (ref 136–145)

## 2013-06-03 LAB — PROTIME-INR: Prothrombin Time: 19.7 secs — ABNORMAL HIGH (ref 11.5–14.7)

## 2013-06-03 LAB — CBC WITH DIFFERENTIAL/PLATELET
Basophil %: 0.5 %
Eosinophil #: 0.1 10*3/uL (ref 0.0–0.7)
Lymphocyte %: 4 %
MCH: 27.3 pg (ref 26.0–34.0)
MCV: 85 fL (ref 80–100)
Monocyte #: 0.9 x10 3/mm (ref 0.2–1.0)
Monocyte %: 10 %
Neutrophil #: 7.3 10*3/uL — ABNORMAL HIGH (ref 1.4–6.5)
Neutrophil %: 84.3 %
Platelet: 118 10*3/uL — ABNORMAL LOW (ref 150–440)
RBC: 3.34 10*6/uL — ABNORMAL LOW (ref 4.40–5.90)
RDW: 20.4 % — ABNORMAL HIGH (ref 11.5–14.5)
WBC: 8.6 10*3/uL (ref 3.8–10.6)

## 2013-06-04 LAB — PHOSPHORUS: Phosphorus: 6.5 mg/dL — ABNORMAL HIGH (ref 2.5–4.9)

## 2013-06-04 LAB — PROTIME-INR: INR: 1.8

## 2013-06-05 LAB — COMPREHENSIVE METABOLIC PANEL
Anion Gap: 8 (ref 7–16)
Chloride: 99 mmol/L (ref 98–107)
Co2: 32 mmol/L (ref 21–32)
Creatinine: 4.31 mg/dL — ABNORMAL HIGH (ref 0.60–1.30)
EGFR (African American): 14 — ABNORMAL LOW
Osmolality: 286 (ref 275–301)
Potassium: 4 mmol/L (ref 3.5–5.1)
SGOT(AST): 37 U/L (ref 15–37)
SGPT (ALT): 14 U/L (ref 12–78)
Sodium: 139 mmol/L (ref 136–145)

## 2013-06-05 LAB — PROTIME-INR: INR: 1.7

## 2013-06-05 LAB — CBC WITH DIFFERENTIAL/PLATELET
Basophil #: 0 10*3/uL (ref 0.0–0.1)
Eosinophil %: 1.7 %
HCT: 29.7 % — ABNORMAL LOW (ref 40.0–52.0)
HGB: 9.5 g/dL — ABNORMAL LOW (ref 13.0–18.0)
Lymphocyte #: 0.3 10*3/uL — ABNORMAL LOW (ref 1.0–3.6)
Monocyte #: 0.7 x10 3/mm (ref 0.2–1.0)
Neutrophil %: 78.8 %
RBC: 3.45 10*6/uL — ABNORMAL LOW (ref 4.40–5.90)
RDW: 20.1 % — ABNORMAL HIGH (ref 11.5–14.5)

## 2013-06-07 LAB — RENAL FUNCTION PANEL
Anion Gap: 9 (ref 7–16)
BUN: 48 mg/dL — ABNORMAL HIGH (ref 7–18)
Calcium, Total: 8.9 mg/dL (ref 8.5–10.1)
Chloride: 94 mmol/L — ABNORMAL LOW (ref 98–107)
Co2: 30 mmol/L (ref 21–32)
EGFR (African American): 8 — ABNORMAL LOW
EGFR (Non-African Amer.): 7 — ABNORMAL LOW
Glucose: 103 mg/dL — ABNORMAL HIGH (ref 65–99)
Osmolality: 279 (ref 275–301)
Phosphorus: 6 mg/dL — ABNORMAL HIGH (ref 2.5–4.9)
Potassium: 4.6 mmol/L (ref 3.5–5.1)
Sodium: 133 mmol/L — ABNORMAL LOW (ref 136–145)

## 2013-06-07 LAB — CBC WITH DIFFERENTIAL/PLATELET
Basophil #: 0 10*3/uL (ref 0.0–0.1)
Basophil %: 0.7 %
Basophil %: 0.9 %
Eosinophil #: 0.2 10*3/uL (ref 0.0–0.7)
Eosinophil %: 4.3 %
Lymphocyte #: 0.3 10*3/uL — ABNORMAL LOW (ref 1.0–3.6)
Lymphocyte %: 8.3 %
MCH: 25.8 pg — ABNORMAL LOW (ref 26.0–34.0)
MCHC: 32 g/dL (ref 32.0–36.0)
MCHC: 30.3 g/dL — ABNORMAL LOW (ref 32.0–36.0)
Monocyte #: 0.5 x10 3/mm (ref 0.2–1.0)
Monocyte #: 0.4 x10 3/mm (ref 0.2–1.0)
Monocyte %: 11.4 %
Neutrophil #: 3.5 10*3/uL (ref 1.4–6.5)
Neutrophil #: 3.6 10*3/uL (ref 1.4–6.5)
Neutrophil %: 76.4 %
Neutrophil %: 78 %
Platelet: 104 10*3/uL — ABNORMAL LOW (ref 150–440)
Platelet: 105 10*3/uL — ABNORMAL LOW (ref 150–440)
RBC: 3.37 10*6/uL — ABNORMAL LOW (ref 4.40–5.90)
RDW: 20.5 % — ABNORMAL HIGH (ref 11.5–14.5)
WBC: 4.7 10*3/uL (ref 3.8–10.6)

## 2013-06-08 LAB — MISC AER/ANAEROBIC CULT.

## 2013-06-16 ENCOUNTER — Inpatient Hospital Stay: Payer: Self-pay | Admitting: Internal Medicine

## 2013-06-16 LAB — PROTIME-INR
INR: 1.6
Prothrombin Time: 18.9 secs — ABNORMAL HIGH (ref 11.5–14.7)

## 2013-06-16 LAB — CK TOTAL AND CKMB (NOT AT ARMC)
CK, Total: 42 U/L (ref 35–232)
CK-MB: 2.4 ng/mL (ref 0.5–3.6)

## 2013-06-16 LAB — TROPONIN I: Troponin-I: 0.21 ng/mL — ABNORMAL HIGH

## 2013-06-16 LAB — COMPREHENSIVE METABOLIC PANEL
Alkaline Phosphatase: 95 U/L
BUN: 89 mg/dL — ABNORMAL HIGH (ref 7–18)
Calcium, Total: 9.7 mg/dL (ref 8.5–10.1)
Creatinine: 6.2 mg/dL — ABNORMAL HIGH (ref 0.60–1.30)
EGFR (Non-African Amer.): 8 — ABNORMAL LOW
Glucose: 178 mg/dL — ABNORMAL HIGH (ref 65–99)
Osmolality: 307 (ref 275–301)
Potassium: 4.6 mmol/L (ref 3.5–5.1)
SGPT (ALT): 12 U/L (ref 12–78)
Total Protein: 6.4 g/dL (ref 6.4–8.2)

## 2013-06-16 LAB — CBC
HCT: 24.6 % — ABNORMAL LOW (ref 40.0–52.0)
HGB: 7.7 g/dL — ABNORMAL LOW (ref 13.0–18.0)
MCH: 27.5 pg (ref 26.0–34.0)
MCHC: 31.3 g/dL — ABNORMAL LOW (ref 32.0–36.0)
Platelet: 217 10*3/uL (ref 150–440)
RBC: 2.8 10*6/uL — ABNORMAL LOW (ref 4.40–5.90)
RDW: 22.7 % — ABNORMAL HIGH (ref 11.5–14.5)

## 2013-06-16 LAB — CLOSTRIDIUM DIFFICILE(ARMC)

## 2013-06-16 LAB — LIPASE, BLOOD: Lipase: 298 U/L (ref 73–393)

## 2013-06-17 LAB — BASIC METABOLIC PANEL WITH GFR
Anion Gap: 8
BUN: 48 mg/dL — ABNORMAL HIGH
Calcium, Total: 8.9 mg/dL
Chloride: 99 mmol/L
Co2: 32 mmol/L
Creatinine: 3.19 mg/dL — ABNORMAL HIGH
EGFR (African American): 20 — ABNORMAL LOW
EGFR (Non-African Amer.): 18 — ABNORMAL LOW
Glucose: 124 mg/dL — ABNORMAL HIGH
Osmolality: 292
Potassium: 3.5 mmol/L
Sodium: 139 mmol/L

## 2013-06-17 LAB — CBC WITH DIFFERENTIAL/PLATELET
Basophil #: 0 10*3/uL (ref 0.0–0.1)
Eosinophil %: 0.1 %
HGB: 7.6 g/dL — ABNORMAL LOW (ref 13.0–18.0)
Lymphocyte #: 0.5 10*3/uL — ABNORMAL LOW (ref 1.0–3.6)
MCH: 27.3 pg (ref 26.0–34.0)
MCV: 86 fL (ref 80–100)
Monocyte #: 0.9 x10 3/mm (ref 0.2–1.0)
Monocyte %: 10.1 %
Neutrophil #: 7.9 10*3/uL — ABNORMAL HIGH (ref 1.4–6.5)

## 2013-06-17 LAB — BASIC METABOLIC PANEL
BUN: 113 mg/dL — ABNORMAL HIGH (ref 7–18)
Calcium, Total: 9.5 mg/dL (ref 8.5–10.1)
Chloride: 99 mmol/L (ref 98–107)
Co2: 26 mmol/L (ref 21–32)
Glucose: 151 mg/dL — ABNORMAL HIGH (ref 65–99)
Osmolality: 313 (ref 275–301)
Sodium: 137 mmol/L (ref 136–145)

## 2013-06-17 LAB — PROTIME-INR: INR: 1.8

## 2013-06-17 LAB — PHOSPHORUS: Phosphorus: 3.6 mg/dL (ref 2.5–4.9)

## 2013-06-18 LAB — COMPREHENSIVE METABOLIC PANEL
Albumin: 2.3 g/dL — ABNORMAL LOW (ref 3.4–5.0)
Alkaline Phosphatase: 83 U/L
Anion Gap: 9 (ref 7–16)
Calcium, Total: 9.2 mg/dL (ref 8.5–10.1)
Chloride: 98 mmol/L (ref 98–107)
Co2: 29 mmol/L (ref 21–32)
EGFR (African American): 13 — ABNORMAL LOW
EGFR (Non-African Amer.): 11 — ABNORMAL LOW
Glucose: 133 mg/dL — ABNORMAL HIGH (ref 65–99)
Osmolality: 294 (ref 275–301)
Potassium: 4.1 mmol/L (ref 3.5–5.1)
SGPT (ALT): 16 U/L (ref 12–78)
Sodium: 136 mmol/L (ref 136–145)

## 2013-06-18 LAB — CBC WITH DIFFERENTIAL/PLATELET
Basophil %: 1.3 %
Eosinophil #: 0.1 10*3/uL (ref 0.0–0.7)
Eosinophil %: 0.5 %
HCT: 28.3 % — ABNORMAL LOW (ref 40.0–52.0)
HGB: 8.9 g/dL — ABNORMAL LOW (ref 13.0–18.0)
Lymphocyte #: 0.6 10*3/uL — ABNORMAL LOW (ref 1.0–3.6)
Lymphocyte %: 6.4 %
MCHC: 31.7 g/dL — ABNORMAL LOW (ref 32.0–36.0)
Neutrophil #: 7.6 10*3/uL — ABNORMAL HIGH (ref 1.4–6.5)
Neutrophil %: 81.2 %
Platelet: 208 10*3/uL (ref 150–440)
RBC: 3.24 10*6/uL — ABNORMAL LOW (ref 4.40–5.90)
RDW: 21 % — ABNORMAL HIGH (ref 11.5–14.5)

## 2013-06-18 LAB — PROTIME-INR: Prothrombin Time: 20.1 secs — ABNORMAL HIGH (ref 11.5–14.7)

## 2013-06-19 LAB — CBC WITH DIFFERENTIAL/PLATELET
Eosinophil #: 0 10*3/uL (ref 0.0–0.7)
HCT: 29.6 % — ABNORMAL LOW (ref 40.0–52.0)
HGB: 9.3 g/dL — ABNORMAL LOW (ref 13.0–18.0)
Lymphocyte #: 0.7 10*3/uL — ABNORMAL LOW (ref 1.0–3.6)
MCH: 28 pg (ref 26.0–34.0)
MCV: 89 fL (ref 80–100)
Neutrophil %: 73.7 %
Platelet: 223 10*3/uL (ref 150–440)
RBC: 3.33 10*6/uL — ABNORMAL LOW (ref 4.40–5.90)
WBC: 7.6 10*3/uL (ref 3.8–10.6)

## 2013-06-19 LAB — PROTIME-INR
INR: 1.5
Prothrombin Time: 17.9 secs — ABNORMAL HIGH (ref 11.5–14.7)

## 2013-06-19 LAB — BASIC METABOLIC PANEL
Chloride: 98 mmol/L (ref 98–107)
Co2: 32 mmol/L (ref 21–32)
Creatinine: 3.86 mg/dL — ABNORMAL HIGH (ref 0.60–1.30)
EGFR (African American): 16 — ABNORMAL LOW
Glucose: 148 mg/dL — ABNORMAL HIGH (ref 65–99)
Osmolality: 284 (ref 275–301)
Potassium: 4 mmol/L (ref 3.5–5.1)

## 2013-06-20 LAB — PROTIME-INR
INR: 1.4
Prothrombin Time: 17.2 secs — ABNORMAL HIGH (ref 11.5–14.7)

## 2013-06-21 LAB — CBC WITH DIFFERENTIAL/PLATELET
Basophil %: 1.4 %
Eosinophil #: 0.1 10*3/uL (ref 0.0–0.7)
Eosinophil %: 2 %
HCT: 28 % — ABNORMAL LOW (ref 40.0–52.0)
HGB: 8.7 g/dL — ABNORMAL LOW (ref 13.0–18.0)
Lymphocyte #: 0.6 10*3/uL — ABNORMAL LOW (ref 1.0–3.6)
Lymphocyte %: 8.7 %
MCH: 27.8 pg (ref 26.0–34.0)
MCV: 89 fL (ref 80–100)
Monocyte #: 0.9 x10 3/mm (ref 0.2–1.0)
Monocyte %: 13 %
Neutrophil #: 5.2 10*3/uL (ref 1.4–6.5)
Platelet: 155 10*3/uL (ref 150–440)
RBC: 3.13 10*6/uL — ABNORMAL LOW (ref 4.40–5.90)
RDW: 21.7 % — ABNORMAL HIGH (ref 11.5–14.5)
WBC: 7 10*3/uL (ref 3.8–10.6)

## 2013-06-21 LAB — RENAL FUNCTION PANEL
Albumin: 2.2 g/dL — ABNORMAL LOW (ref 3.4–5.0)
Anion Gap: 6 — ABNORMAL LOW (ref 7–16)
BUN: 48 mg/dL — ABNORMAL HIGH (ref 7–18)
Calcium, Total: 8.5 mg/dL (ref 8.5–10.1)
Chloride: 96 mmol/L — ABNORMAL LOW (ref 98–107)
Co2: 31 mmol/L (ref 21–32)
EGFR (African American): 10 — ABNORMAL LOW
EGFR (Non-African Amer.): 9 — ABNORMAL LOW
Glucose: 156 mg/dL — ABNORMAL HIGH (ref 65–99)
Phosphorus: 5.4 mg/dL — ABNORMAL HIGH (ref 2.5–4.9)
Potassium: 4 mmol/L (ref 3.5–5.1)
Sodium: 133 mmol/L — ABNORMAL LOW (ref 136–145)

## 2013-06-21 LAB — CULTURE, BLOOD (SINGLE)

## 2013-06-21 LAB — PROTIME-INR: INR: 1.4

## 2013-06-22 LAB — BASIC METABOLIC PANEL
Anion Gap: 5 — ABNORMAL LOW (ref 7–16)
Chloride: 97 mmol/L — ABNORMAL LOW (ref 98–107)
EGFR (African American): 14 — ABNORMAL LOW
EGFR (Non-African Amer.): 12 — ABNORMAL LOW
Glucose: 151 mg/dL — ABNORMAL HIGH (ref 65–99)
Osmolality: 280 (ref 275–301)
Sodium: 135 mmol/L — ABNORMAL LOW (ref 136–145)

## 2013-06-22 LAB — CBC WITH DIFFERENTIAL/PLATELET
Basophil #: 0 10*3/uL (ref 0.0–0.1)
Basophil %: 0.7 %
Eosinophil #: 0.2 10*3/uL (ref 0.0–0.7)
Eosinophil %: 2.9 %
Lymphocyte #: 0.5 10*3/uL — ABNORMAL LOW (ref 1.0–3.6)
Lymphocyte %: 8.6 %
MCH: 28.8 pg (ref 26.0–34.0)
MCHC: 31.9 g/dL — ABNORMAL LOW (ref 32.0–36.0)
MCV: 90 fL (ref 80–100)
Monocyte %: 11.6 %
Platelet: 111 10*3/uL — ABNORMAL LOW (ref 150–440)
RBC: 2.9 10*6/uL — ABNORMAL LOW (ref 4.40–5.90)
RDW: 21.5 % — ABNORMAL HIGH (ref 11.5–14.5)
WBC: 6 10*3/uL (ref 3.8–10.6)

## 2013-06-22 LAB — PROTIME-INR: INR: 1.4

## 2013-06-23 LAB — COMPREHENSIVE METABOLIC PANEL
Albumin: 2.1 g/dL — ABNORMAL LOW (ref 3.4–5.0)
Alkaline Phosphatase: 101 U/L
Anion Gap: 5 — ABNORMAL LOW (ref 7–16)
Chloride: 97 mmol/L — ABNORMAL LOW (ref 98–107)
Co2: 32 mmol/L (ref 21–32)
Creatinine: 5.46 mg/dL — ABNORMAL HIGH (ref 0.60–1.30)
EGFR (African American): 11 — ABNORMAL LOW
EGFR (Non-African Amer.): 9 — ABNORMAL LOW
Osmolality: 280 (ref 275–301)
SGPT (ALT): 13 U/L (ref 12–78)
Sodium: 134 mmol/L — ABNORMAL LOW (ref 136–145)
Total Protein: 5.5 g/dL — ABNORMAL LOW (ref 6.4–8.2)

## 2013-06-23 LAB — PHOSPHORUS: Phosphorus: 5 mg/dL — ABNORMAL HIGH (ref 2.5–4.9)

## 2013-06-23 LAB — CBC WITH DIFFERENTIAL/PLATELET
Basophil #: 0.1 10*3/uL (ref 0.0–0.1)
Eosinophil #: 0.2 10*3/uL (ref 0.0–0.7)
Eosinophil %: 3.3 %
HCT: 26.2 % — ABNORMAL LOW (ref 40.0–52.0)
HGB: 8.2 g/dL — ABNORMAL LOW (ref 13.0–18.0)
Lymphocyte #: 0.5 10*3/uL — ABNORMAL LOW (ref 1.0–3.6)
MCH: 28.3 pg (ref 26.0–34.0)
MCHC: 31.4 g/dL — ABNORMAL LOW (ref 32.0–36.0)
MCV: 90 fL (ref 80–100)
Monocyte #: 0.6 x10 3/mm (ref 0.2–1.0)
Monocyte %: 10.8 %
Neutrophil %: 76.5 %
RBC: 2.91 10*6/uL — ABNORMAL LOW (ref 4.40–5.90)
RDW: 22.5 % — ABNORMAL HIGH (ref 11.5–14.5)
WBC: 5.9 10*3/uL (ref 3.8–10.6)

## 2013-06-23 LAB — PROTIME-INR: Prothrombin Time: 16.9 secs — ABNORMAL HIGH (ref 11.5–14.7)

## 2013-06-24 LAB — CBC WITH DIFFERENTIAL/PLATELET
Eosinophil %: 2.2 %
MCH: 29.7 pg (ref 26.0–34.0)
MCHC: 32.8 g/dL (ref 32.0–36.0)
Monocyte #: 0.9 x10 3/mm (ref 0.2–1.0)
Neutrophil #: 5.7 10*3/uL (ref 1.4–6.5)
Neutrophil %: 77.6 %
Platelet: 137 10*3/uL — ABNORMAL LOW (ref 150–440)
RBC: 3.13 10*6/uL — ABNORMAL LOW (ref 4.40–5.90)
RDW: 24 % — ABNORMAL HIGH (ref 11.5–14.5)
WBC: 7.3 10*3/uL (ref 3.8–10.6)

## 2013-06-25 LAB — RENAL FUNCTION PANEL
Albumin: 2 g/dL — ABNORMAL LOW (ref 3.4–5.0)
Anion Gap: 5 — ABNORMAL LOW (ref 7–16)
BUN: 43 mg/dL — ABNORMAL HIGH (ref 7–18)
Chloride: 97 mmol/L — ABNORMAL LOW (ref 98–107)
Creatinine: 5.49 mg/dL — ABNORMAL HIGH (ref 0.60–1.30)
EGFR (Non-African Amer.): 9 — ABNORMAL LOW
Glucose: 171 mg/dL — ABNORMAL HIGH (ref 65–99)
Osmolality: 283 (ref 275–301)
Phosphorus: 5 mg/dL — ABNORMAL HIGH (ref 2.5–4.9)

## 2013-06-25 LAB — CBC WITH DIFFERENTIAL/PLATELET
Basophil #: 0 10*3/uL (ref 0.0–0.1)
Eosinophil #: 0.1 10*3/uL (ref 0.0–0.7)
Eosinophil %: 2.4 %
HCT: 25.2 % — ABNORMAL LOW (ref 40.0–52.0)
HGB: 8 g/dL — ABNORMAL LOW (ref 13.0–18.0)
Monocyte #: 0.5 x10 3/mm (ref 0.2–1.0)
Monocyte %: 10.4 %
Neutrophil #: 3.7 10*3/uL (ref 1.4–6.5)
Neutrophil %: 80.3 %
Platelet: 79 10*3/uL — ABNORMAL LOW (ref 150–440)
RDW: 23.7 % — ABNORMAL HIGH (ref 11.5–14.5)

## 2013-06-26 LAB — CBC WITH DIFFERENTIAL/PLATELET
Basophil #: 0.1 10*3/uL (ref 0.0–0.1)
Basophil %: 1.2 %
Eosinophil #: 0.1 10*3/uL (ref 0.0–0.7)
Eosinophil %: 2 %
HGB: 8.6 g/dL — ABNORMAL LOW (ref 13.0–18.0)
Lymphocyte %: 8.2 %
MCH: 29 pg (ref 26.0–34.0)
MCHC: 31.9 g/dL — ABNORMAL LOW (ref 32.0–36.0)
Monocyte #: 0.6 x10 3/mm (ref 0.2–1.0)
Neutrophil %: 74.9 %
Platelet: 84 10*3/uL — ABNORMAL LOW (ref 150–440)
RDW: 23.8 % — ABNORMAL HIGH (ref 11.5–14.5)
WBC: 4.7 10*3/uL (ref 3.8–10.6)

## 2013-06-26 LAB — COMPREHENSIVE METABOLIC PANEL
Albumin: 2.1 g/dL — ABNORMAL LOW (ref 3.4–5.0)
Alkaline Phosphatase: 102 U/L
Anion Gap: 5 — ABNORMAL LOW (ref 7–16)
BUN: 33 mg/dL — ABNORMAL HIGH (ref 7–18)
Creatinine: 4.3 mg/dL — ABNORMAL HIGH (ref 0.60–1.30)
EGFR (African American): 14 — ABNORMAL LOW
Glucose: 205 mg/dL — ABNORMAL HIGH (ref 65–99)
SGOT(AST): 18 U/L (ref 15–37)
SGPT (ALT): 11 U/L — ABNORMAL LOW (ref 12–78)
Sodium: 136 mmol/L (ref 136–145)
Total Protein: 5.7 g/dL — ABNORMAL LOW (ref 6.4–8.2)

## 2013-06-28 LAB — CBC WITH DIFFERENTIAL/PLATELET
Basophil #: 0.1 10*3/uL (ref 0.0–0.1)
Basophil %: 0.9 %
Eosinophil #: 0.2 10*3/uL (ref 0.0–0.7)
Eosinophil %: 3.2 %
HCT: 26.2 % — ABNORMAL LOW (ref 40.0–52.0)
Lymphocyte #: 0.4 10*3/uL — ABNORMAL LOW (ref 1.0–3.6)
Lymphocyte %: 6.2 %
MCH: 28.5 pg (ref 26.0–34.0)
Monocyte #: 0.8 x10 3/mm (ref 0.2–1.0)
Monocyte %: 13 %
Neutrophil #: 4.6 10*3/uL (ref 1.4–6.5)
Neutrophil %: 76.7 %
Platelet: 115 10*3/uL — ABNORMAL LOW (ref 150–440)
RDW: 23.8 % — ABNORMAL HIGH (ref 11.5–14.5)
WBC: 6 10*3/uL (ref 3.8–10.6)

## 2013-06-29 LAB — RENAL FUNCTION PANEL
Calcium, Total: 8.7 mg/dL (ref 8.5–10.1)
Chloride: 99 mmol/L (ref 98–107)
Co2: 32 mmol/L (ref 21–32)
Creatinine: 4.15 mg/dL — ABNORMAL HIGH (ref 0.60–1.30)
EGFR (African American): 15 — ABNORMAL LOW
EGFR (Non-African Amer.): 13 — ABNORMAL LOW
Glucose: 169 mg/dL — ABNORMAL HIGH (ref 65–99)
Phosphorus: 4.3 mg/dL (ref 2.5–4.9)
Potassium: 4.2 mmol/L (ref 3.5–5.1)
Sodium: 135 mmol/L — ABNORMAL LOW (ref 136–145)

## 2013-06-29 LAB — CBC WITH DIFFERENTIAL/PLATELET
Basophil %: 1.8 %
Eosinophil #: 0.1 10*3/uL (ref 0.0–0.7)
Eosinophil %: 1.8 %
Lymphocyte #: 0.6 10*3/uL — ABNORMAL LOW (ref 1.0–3.6)
Lymphocyte %: 10.2 %
MCHC: 32.3 g/dL (ref 32.0–36.0)
MCV: 92 fL (ref 80–100)
Monocyte #: 0.8 x10 3/mm (ref 0.2–1.0)
Neutrophil %: 72.9 %
RBC: 2.88 10*6/uL — ABNORMAL LOW (ref 4.40–5.90)
RDW: 23.2 % — ABNORMAL HIGH (ref 11.5–14.5)
WBC: 6 10*3/uL (ref 3.8–10.6)

## 2013-06-29 LAB — BASIC METABOLIC PANEL
Anion Gap: 5 — ABNORMAL LOW (ref 7–16)
Calcium, Total: 9 mg/dL (ref 8.5–10.1)
Chloride: 98 mmol/L (ref 98–107)
Co2: 34 mmol/L — ABNORMAL HIGH (ref 21–32)
Creatinine: 3.89 mg/dL — ABNORMAL HIGH (ref 0.60–1.30)
EGFR (African American): 16 — ABNORMAL LOW
Glucose: 132 mg/dL — ABNORMAL HIGH (ref 65–99)
Potassium: 4.7 mmol/L (ref 3.5–5.1)
Sodium: 137 mmol/L (ref 136–145)

## 2013-06-29 LAB — PHOSPHORUS: Phosphorus: 5 mg/dL — ABNORMAL HIGH (ref 2.5–4.9)

## 2013-07-01 ENCOUNTER — Ambulatory Visit: Payer: Self-pay | Admitting: Internal Medicine

## 2013-07-01 LAB — RENAL FUNCTION PANEL
Albumin: 2.4 g/dL — ABNORMAL LOW (ref 3.4–5.0)
Anion Gap: 4 — ABNORMAL LOW (ref 7–16)
BUN: 44 mg/dL — ABNORMAL HIGH (ref 7–18)
Calcium, Total: 9.3 mg/dL (ref 8.5–10.1)
Chloride: 99 mmol/L (ref 98–107)
Co2: 33 mmol/L — ABNORMAL HIGH (ref 21–32)
Creatinine: 4.66 mg/dL — ABNORMAL HIGH (ref 0.60–1.30)
EGFR (African American): 13 — ABNORMAL LOW
EGFR (Non-African Amer.): 11 — ABNORMAL LOW
Glucose: 189 mg/dL — ABNORMAL HIGH (ref 65–99)
Osmolality: 288 (ref 275–301)
Phosphorus: 4.4 mg/dL (ref 2.5–4.9)
Potassium: 4.4 mmol/L (ref 3.5–5.1)
Sodium: 136 mmol/L (ref 136–145)

## 2013-07-01 LAB — BASIC METABOLIC PANEL
Anion Gap: 7 (ref 7–16)
BUN: 41 mg/dL — ABNORMAL HIGH (ref 7–18)
Calcium, Total: 9.1 mg/dL (ref 8.5–10.1)
Chloride: 99 mmol/L (ref 98–107)
Co2: 33 mmol/L — ABNORMAL HIGH (ref 21–32)
Creatinine: 4.47 mg/dL — ABNORMAL HIGH (ref 0.60–1.30)
EGFR (African American): 14 — ABNORMAL LOW
EGFR (Non-African Amer.): 12 — ABNORMAL LOW
Glucose: 145 mg/dL — ABNORMAL HIGH (ref 65–99)
Osmolality: 290 (ref 275–301)
Potassium: 4.7 mmol/L (ref 3.5–5.1)
Sodium: 139 mmol/L (ref 136–145)

## 2013-07-01 LAB — HEMOGLOBIN: HGB: 8.6 g/dL — ABNORMAL LOW (ref 13.0–18.0)

## 2013-07-02 LAB — MAGNESIUM: Magnesium: 1.7 mg/dL — ABNORMAL LOW

## 2013-07-03 LAB — CBC WITH DIFFERENTIAL/PLATELET
BASOS PCT: 0.7 %
Basophil #: 0.1 10*3/uL (ref 0.0–0.1)
Eosinophil #: 0.1 10*3/uL (ref 0.0–0.7)
Eosinophil %: 1.1 %
HCT: 26.5 % — ABNORMAL LOW (ref 40.0–52.0)
HGB: 8.5 g/dL — AB (ref 13.0–18.0)
LYMPHS ABS: 0.5 10*3/uL — AB (ref 1.0–3.6)
Lymphocyte %: 6.4 %
MCH: 29 pg (ref 26.0–34.0)
MCHC: 32.1 g/dL (ref 32.0–36.0)
MCV: 91 fL (ref 80–100)
Monocyte #: 0.7 x10 3/mm (ref 0.2–1.0)
Monocyte %: 9.1 %
NEUTROS PCT: 82.7 %
Neutrophil #: 6.8 10*3/uL — ABNORMAL HIGH (ref 1.4–6.5)
Platelet: 138 10*3/uL — ABNORMAL LOW (ref 150–440)
RBC: 2.93 10*6/uL — ABNORMAL LOW (ref 4.40–5.90)
RDW: 23.1 % — AB (ref 11.5–14.5)
WBC: 8.2 10*3/uL (ref 3.8–10.6)

## 2013-07-03 LAB — COMPREHENSIVE METABOLIC PANEL
ALBUMIN: 2.5 g/dL — AB (ref 3.4–5.0)
ALK PHOS: 128 U/L — AB
Anion Gap: 3 — ABNORMAL LOW (ref 7–16)
BILIRUBIN TOTAL: 0.6 mg/dL (ref 0.2–1.0)
BUN: 44 mg/dL — ABNORMAL HIGH (ref 7–18)
CALCIUM: 9.2 mg/dL (ref 8.5–10.1)
CHLORIDE: 98 mmol/L (ref 98–107)
Co2: 34 mmol/L — ABNORMAL HIGH (ref 21–32)
Creatinine: 4.55 mg/dL — ABNORMAL HIGH (ref 0.60–1.30)
EGFR (African American): 13 — ABNORMAL LOW
EGFR (Non-African Amer.): 11 — ABNORMAL LOW
Glucose: 116 mg/dL — ABNORMAL HIGH (ref 65–99)
OSMOLALITY: 282 (ref 275–301)
Potassium: 4.7 mmol/L (ref 3.5–5.1)
SGOT(AST): 22 U/L (ref 15–37)
SGPT (ALT): 10 U/L — ABNORMAL LOW (ref 12–78)
Sodium: 135 mmol/L — ABNORMAL LOW (ref 136–145)
Total Protein: 6 g/dL — ABNORMAL LOW (ref 6.4–8.2)

## 2013-07-03 LAB — MAGNESIUM: Magnesium: 2.1 mg/dL

## 2013-07-04 LAB — CBC WITH DIFFERENTIAL/PLATELET
BASOS PCT: 0.5 %
Basophil #: 0 10*3/uL (ref 0.0–0.1)
Eosinophil #: 0 10*3/uL (ref 0.0–0.7)
Eosinophil %: 0.5 %
HCT: 27.3 % — AB (ref 40.0–52.0)
HGB: 8.6 g/dL — ABNORMAL LOW (ref 13.0–18.0)
LYMPHS ABS: 0.5 10*3/uL — AB (ref 1.0–3.6)
Lymphocyte %: 7.5 %
MCH: 28.6 pg (ref 26.0–34.0)
MCHC: 31.7 g/dL — ABNORMAL LOW (ref 32.0–36.0)
MCV: 91 fL (ref 80–100)
Monocyte #: 0.7 x10 3/mm (ref 0.2–1.0)
Monocyte %: 9.8 %
NEUTROS PCT: 81.7 %
Neutrophil #: 5.8 10*3/uL (ref 1.4–6.5)
PLATELETS: 134 10*3/uL — AB (ref 150–440)
RBC: 3.01 10*6/uL — AB (ref 4.40–5.90)
RDW: 22.6 % — ABNORMAL HIGH (ref 11.5–14.5)
WBC: 7 10*3/uL (ref 3.8–10.6)

## 2013-07-04 LAB — COMPREHENSIVE METABOLIC PANEL
ALBUMIN: 2.6 g/dL — AB (ref 3.4–5.0)
ALK PHOS: 133 U/L — AB
ALT: 11 U/L — AB (ref 12–78)
Anion Gap: 3 — ABNORMAL LOW (ref 7–16)
BUN: 32 mg/dL — ABNORMAL HIGH (ref 7–18)
Bilirubin,Total: 0.7 mg/dL (ref 0.2–1.0)
CHLORIDE: 97 mmol/L — AB (ref 98–107)
CO2: 34 mmol/L — AB (ref 21–32)
Calcium, Total: 9.1 mg/dL (ref 8.5–10.1)
Creatinine: 3.58 mg/dL — ABNORMAL HIGH (ref 0.60–1.30)
EGFR (African American): 18 — ABNORMAL LOW
EGFR (Non-African Amer.): 15 — ABNORMAL LOW
GLUCOSE: 122 mg/dL — AB (ref 65–99)
OSMOLALITY: 276 (ref 275–301)
POTASSIUM: 4.3 mmol/L (ref 3.5–5.1)
SGOT(AST): 23 U/L (ref 15–37)
Sodium: 134 mmol/L — ABNORMAL LOW (ref 136–145)
Total Protein: 6.1 g/dL — ABNORMAL LOW (ref 6.4–8.2)

## 2013-07-04 LAB — PHOSPHORUS: PHOSPHORUS: 3.6 mg/dL (ref 2.5–4.9)

## 2013-07-05 LAB — CBC WITH DIFFERENTIAL/PLATELET
BASOS PCT: 0.8 %
Basophil #: 0.1 10*3/uL (ref 0.0–0.1)
Eosinophil #: 0.1 10*3/uL (ref 0.0–0.7)
Eosinophil %: 0.6 %
HCT: 28 % — AB (ref 40.0–52.0)
HGB: 9 g/dL — ABNORMAL LOW (ref 13.0–18.0)
Lymphocyte #: 0.4 10*3/uL — ABNORMAL LOW (ref 1.0–3.6)
Lymphocyte %: 5.2 %
MCH: 28.8 pg (ref 26.0–34.0)
MCHC: 32.1 g/dL (ref 32.0–36.0)
MCV: 90 fL (ref 80–100)
MONOS PCT: 8.9 %
Monocyte #: 0.7 x10 3/mm (ref 0.2–1.0)
Neutrophil #: 6.8 10*3/uL — ABNORMAL HIGH (ref 1.4–6.5)
Neutrophil %: 84.5 %
PLATELETS: 144 10*3/uL — AB (ref 150–440)
RBC: 3.12 10*6/uL — ABNORMAL LOW (ref 4.40–5.90)
RDW: 23.8 % — ABNORMAL HIGH (ref 11.5–14.5)
WBC: 8.1 10*3/uL (ref 3.8–10.6)

## 2013-07-05 LAB — COMPREHENSIVE METABOLIC PANEL
ALK PHOS: 131 U/L — AB
ALT: 10 U/L — AB (ref 12–78)
AST: 19 U/L (ref 15–37)
Albumin: 2.5 g/dL — ABNORMAL LOW (ref 3.4–5.0)
Anion Gap: 4 — ABNORMAL LOW (ref 7–16)
BILIRUBIN TOTAL: 0.7 mg/dL (ref 0.2–1.0)
BUN: 42 mg/dL — ABNORMAL HIGH (ref 7–18)
Calcium, Total: 9.2 mg/dL (ref 8.5–10.1)
Chloride: 96 mmol/L — ABNORMAL LOW (ref 98–107)
Co2: 33 mmol/L — ABNORMAL HIGH (ref 21–32)
Creatinine: 4.68 mg/dL — ABNORMAL HIGH (ref 0.60–1.30)
EGFR (African American): 13 — ABNORMAL LOW
GFR CALC NON AF AMER: 11 — AB
Glucose: 130 mg/dL — ABNORMAL HIGH (ref 65–99)
Osmolality: 279 (ref 275–301)
Potassium: 4.4 mmol/L (ref 3.5–5.1)
SODIUM: 133 mmol/L — AB (ref 136–145)
Total Protein: 6.1 g/dL — ABNORMAL LOW (ref 6.4–8.2)

## 2013-07-05 LAB — PHOSPHORUS: Phosphorus: 5.2 mg/dL — ABNORMAL HIGH (ref 2.5–4.9)

## 2013-07-06 LAB — TROPONIN I
TROPONIN-I: 0.1 ng/mL — AB
Troponin-I: 0.1 ng/mL — ABNORMAL HIGH

## 2013-07-06 LAB — PHOSPHORUS: Phosphorus: 3.9 mg/dL (ref 2.5–4.9)

## 2013-07-07 LAB — TROPONIN I: TROPONIN-I: 0.08 ng/mL — AB

## 2013-07-09 ENCOUNTER — Inpatient Hospital Stay: Payer: Self-pay | Admitting: Internal Medicine

## 2013-07-09 LAB — TROPONIN I
TROPONIN-I: 0.2 ng/mL — AB
Troponin-I: 0.18 ng/mL — ABNORMAL HIGH

## 2013-07-09 LAB — CBC
HCT: 31.8 % — ABNORMAL LOW (ref 40.0–52.0)
HGB: 10 g/dL — ABNORMAL LOW (ref 13.0–18.0)
MCH: 28.6 pg (ref 26.0–34.0)
MCHC: 31.3 g/dL — AB (ref 32.0–36.0)
MCV: 91 fL (ref 80–100)
PLATELETS: 174 10*3/uL (ref 150–440)
RBC: 3.48 10*6/uL — ABNORMAL LOW (ref 4.40–5.90)
RDW: 23.1 % — AB (ref 11.5–14.5)
WBC: 8 10*3/uL (ref 3.8–10.6)

## 2013-07-09 LAB — COMPREHENSIVE METABOLIC PANEL
ALBUMIN: 3.1 g/dL — AB (ref 3.4–5.0)
Alkaline Phosphatase: 173 U/L — ABNORMAL HIGH
Anion Gap: 6 — ABNORMAL LOW (ref 7–16)
BILIRUBIN TOTAL: 0.7 mg/dL (ref 0.2–1.0)
BUN: 30 mg/dL — ABNORMAL HIGH (ref 7–18)
CO2: 32 mmol/L (ref 21–32)
CREATININE: 2.81 mg/dL — AB (ref 0.60–1.30)
Calcium, Total: 9 mg/dL (ref 8.5–10.1)
Chloride: 99 mmol/L (ref 98–107)
EGFR (African American): 24 — ABNORMAL LOW
GFR CALC NON AF AMER: 21 — AB
GLUCOSE: 133 mg/dL — AB (ref 65–99)
Osmolality: 282 (ref 275–301)
Potassium: 4.2 mmol/L (ref 3.5–5.1)
SGOT(AST): 41 U/L — ABNORMAL HIGH (ref 15–37)
SGPT (ALT): 19 U/L (ref 12–78)
SODIUM: 137 mmol/L (ref 136–145)
Total Protein: 6.6 g/dL (ref 6.4–8.2)

## 2013-07-09 LAB — CK TOTAL AND CKMB (NOT AT ARMC)
CK, TOTAL: 34 U/L — AB (ref 35–232)
CK, Total: 23 U/L — ABNORMAL LOW (ref 35–232)
CK-MB: 3.1 ng/mL (ref 0.5–3.6)
CK-MB: 2.7 ng/mL (ref 0.5–3.6)

## 2013-07-09 LAB — PROTIME-INR
INR: 1.5
PROTHROMBIN TIME: 17.7 s — AB (ref 11.5–14.7)

## 2013-07-09 LAB — PRO B NATRIURETIC PEPTIDE: B-Type Natriuretic Peptide: 112315 pg/mL — ABNORMAL HIGH (ref 0–450)

## 2013-07-10 LAB — BASIC METABOLIC PANEL
ANION GAP: 4 — AB (ref 7–16)
ANION GAP: 5 — AB (ref 7–16)
Anion Gap: 10 (ref 7–16)
Anion Gap: 4 — ABNORMAL LOW (ref 7–16)
Anion Gap: 5 — ABNORMAL LOW (ref 7–16)
Anion Gap: 4 — ABNORMAL LOW (ref 7–16)
BUN: 38 mg/dL — ABNORMAL HIGH (ref 7–18)
BUN: 39 mg/dL — ABNORMAL HIGH (ref 7–18)
BUN: 38 mg/dL — ABNORMAL HIGH (ref 7–18)
BUN: 36 mg/dL — AB (ref 7–18)
BUN: 35 mg/dL — AB (ref 7–18)
BUN: 35 mg/dL — AB (ref 7–18)
CALCIUM: 9 mg/dL (ref 8.5–10.1)
CALCIUM: 8.9 mg/dL (ref 8.5–10.1)
CALCIUM: 8.8 mg/dL (ref 8.5–10.1)
CALCIUM: 8.8 mg/dL (ref 8.5–10.1)
CHLORIDE: 97 mmol/L — AB (ref 98–107)
CHLORIDE: 98 mmol/L (ref 98–107)
CHLORIDE: 99 mmol/L (ref 98–107)
CHLORIDE: 99 mmol/L (ref 98–107)
CO2: 33 mmol/L — AB (ref 21–32)
CO2: 34 mmol/L — AB (ref 21–32)
CO2: 32 mmol/L (ref 21–32)
CO2: 32 mmol/L (ref 21–32)
CREATININE: 3.65 mg/dL — AB (ref 0.60–1.30)
CREATININE: 3.18 mg/dL — AB (ref 0.60–1.30)
Calcium, Total: 9.5 mg/dL (ref 8.5–10.1)
Calcium, Total: 9 mg/dL (ref 8.5–10.1)
Chloride: 98 mmol/L (ref 98–107)
Chloride: 100 mmol/L (ref 98–107)
Co2: 29 mmol/L (ref 21–32)
Co2: 31 mmol/L (ref 21–32)
Creatinine: 3.51 mg/dL — ABNORMAL HIGH (ref 0.60–1.30)
Creatinine: 3.38 mg/dL — ABNORMAL HIGH (ref 0.60–1.30)
Creatinine: 3.4 mg/dL — ABNORMAL HIGH (ref 0.60–1.30)
Creatinine: 3.27 mg/dL — ABNORMAL HIGH (ref 0.60–1.30)
EGFR (African American): 18 — ABNORMAL LOW
EGFR (African American): 17 — ABNORMAL LOW
EGFR (African American): 19 — ABNORMAL LOW
EGFR (African American): 20 — ABNORMAL LOW
EGFR (African American): 21 — ABNORMAL LOW
EGFR (Non-African Amer.): 16 — ABNORMAL LOW
EGFR (Non-African Amer.): 15 — ABNORMAL LOW
EGFR (Non-African Amer.): 16 — ABNORMAL LOW
EGFR (Non-African Amer.): 16 — ABNORMAL LOW
EGFR (Non-African Amer.): 17 — ABNORMAL LOW
GFR CALC AF AMER: 19 — AB
GFR CALC NON AF AMER: 18 — AB
GLUCOSE: 142 mg/dL — AB (ref 65–99)
GLUCOSE: 155 mg/dL — AB (ref 65–99)
Glucose: 116 mg/dL — ABNORMAL HIGH (ref 65–99)
Glucose: 155 mg/dL — ABNORMAL HIGH (ref 65–99)
Glucose: 176 mg/dL — ABNORMAL HIGH (ref 65–99)
Glucose: 113 mg/dL — ABNORMAL HIGH (ref 65–99)
OSMOLALITY: 281 (ref 275–301)
Osmolality: 282 (ref 275–301)
Osmolality: 282 (ref 275–301)
Osmolality: 284 (ref 275–301)
Osmolality: 283 (ref 275–301)
Osmolality: 283 (ref 275–301)
POTASSIUM: 4.2 mmol/L (ref 3.5–5.1)
POTASSIUM: 4.1 mmol/L (ref 3.5–5.1)
POTASSIUM: 4.1 mmol/L (ref 3.5–5.1)
POTASSIUM: 4.2 mmol/L (ref 3.5–5.1)
POTASSIUM: 4.2 mmol/L (ref 3.5–5.1)
Potassium: 4.1 mmol/L (ref 3.5–5.1)
SODIUM: 135 mmol/L — AB (ref 136–145)
Sodium: 136 mmol/L (ref 136–145)
Sodium: 135 mmol/L — ABNORMAL LOW (ref 136–145)
Sodium: 136 mmol/L (ref 136–145)
Sodium: 135 mmol/L — ABNORMAL LOW (ref 136–145)
Sodium: 137 mmol/L (ref 136–145)

## 2013-07-10 LAB — CBC WITH DIFFERENTIAL/PLATELET
Bands: 2 %
EOS PCT: 1 %
HCT: 31.5 % — ABNORMAL LOW (ref 40.0–52.0)
HGB: 10 g/dL — AB (ref 13.0–18.0)
Lymphocytes: 2 %
MCH: 29.2 pg (ref 26.0–34.0)
MCHC: 31.6 g/dL — AB (ref 32.0–36.0)
MCV: 92 fL (ref 80–100)
Monocytes: 8 %
NRBC/100 WBC: 3 /
PLATELETS: 207 10*3/uL (ref 150–440)
RBC: 3.42 10*6/uL — ABNORMAL LOW (ref 4.40–5.90)
RDW: 22.8 % — ABNORMAL HIGH (ref 11.5–14.5)
SEGMENTED NEUTROPHILS: 87 %
WBC: 11.1 10*3/uL — ABNORMAL HIGH (ref 3.8–10.6)

## 2013-07-10 LAB — TROPONIN I: Troponin-I: 0.2 ng/mL — ABNORMAL HIGH

## 2013-07-10 LAB — CK TOTAL AND CKMB (NOT AT ARMC)
CK, Total: 19 U/L — ABNORMAL LOW (ref 35–232)
CK-MB: 2.6 ng/mL (ref 0.5–3.6)

## 2013-07-11 LAB — BASIC METABOLIC PANEL
Anion Gap: 5 — ABNORMAL LOW (ref 7–16)
Anion Gap: 6 — ABNORMAL LOW (ref 7–16)
Anion Gap: 5 — ABNORMAL LOW (ref 7–16)
Anion Gap: 3 — ABNORMAL LOW (ref 7–16)
Anion Gap: 3 — ABNORMAL LOW (ref 7–16)
BUN: 37 mg/dL — ABNORMAL HIGH (ref 7–18)
BUN: 32 mg/dL — AB (ref 7–18)
BUN: 28 mg/dL — ABNORMAL HIGH (ref 7–18)
BUN: 36 mg/dL — AB (ref 7–18)
BUN: 31 mg/dL — ABNORMAL HIGH (ref 7–18)
CALCIUM: 9.1 mg/dL (ref 8.5–10.1)
CALCIUM: 9.1 mg/dL (ref 8.5–10.1)
CALCIUM: 8.9 mg/dL (ref 8.5–10.1)
CHLORIDE: 99 mmol/L (ref 98–107)
CO2: 32 mmol/L (ref 21–32)
CREATININE: 2.84 mg/dL — AB (ref 0.60–1.30)
CREATININE: 3.04 mg/dL — AB (ref 0.60–1.30)
Calcium, Total: 9.1 mg/dL (ref 8.5–10.1)
Calcium, Total: 9.1 mg/dL (ref 8.5–10.1)
Chloride: 99 mmol/L (ref 98–107)
Chloride: 100 mmol/L (ref 98–107)
Chloride: 101 mmol/L (ref 98–107)
Chloride: 100 mmol/L (ref 98–107)
Co2: 32 mmol/L (ref 21–32)
Co2: 30 mmol/L (ref 21–32)
Co2: 30 mmol/L (ref 21–32)
Co2: 30 mmol/L (ref 21–32)
Creatinine: 3.32 mg/dL — ABNORMAL HIGH (ref 0.60–1.30)
Creatinine: 2.93 mg/dL — ABNORMAL HIGH (ref 0.60–1.30)
Creatinine: 2.69 mg/dL — ABNORMAL HIGH (ref 0.60–1.30)
EGFR (African American): 22 — ABNORMAL LOW
EGFR (African American): 23 — ABNORMAL LOW
EGFR (African American): 25 — ABNORMAL LOW
EGFR (Non-African Amer.): 19 — ABNORMAL LOW
EGFR (Non-African Amer.): 17 — ABNORMAL LOW
EGFR (Non-African Amer.): 22 — ABNORMAL LOW
EGFR (Non-African Amer.): 20 — ABNORMAL LOW
GFR CALC AF AMER: 20 — AB
GFR CALC AF AMER: 24 — AB
GFR CALC NON AF AMER: 20 — AB
GLUCOSE: 156 mg/dL — AB (ref 65–99)
GLUCOSE: 189 mg/dL — AB (ref 65–99)
Glucose: 98 mg/dL (ref 65–99)
Glucose: 78 mg/dL (ref 65–99)
Glucose: 174 mg/dL — ABNORMAL HIGH (ref 65–99)
Osmolality: 280 (ref 275–301)
Osmolality: 278 (ref 275–301)
Osmolality: 280 (ref 275–301)
Osmolality: 282 (ref 275–301)
Osmolality: 278 (ref 275–301)
POTASSIUM: 4.2 mmol/L (ref 3.5–5.1)
POTASSIUM: 4.2 mmol/L (ref 3.5–5.1)
POTASSIUM: 4.1 mmol/L (ref 3.5–5.1)
POTASSIUM: 4.3 mmol/L (ref 3.5–5.1)
Potassium: 4.1 mmol/L (ref 3.5–5.1)
SODIUM: 135 mmol/L — AB (ref 136–145)
SODIUM: 136 mmol/L (ref 136–145)
SODIUM: 136 mmol/L (ref 136–145)
SODIUM: 135 mmol/L — AB (ref 136–145)
Sodium: 133 mmol/L — ABNORMAL LOW (ref 136–145)

## 2013-07-11 LAB — PHOSPHORUS: Phosphorus: 3.9 mg/dL (ref 2.5–4.9)

## 2013-07-11 LAB — CBC WITH DIFFERENTIAL/PLATELET
BASOS PCT: 0.3 %
Basophil #: 0 10*3/uL (ref 0.0–0.1)
EOS PCT: 1.8 %
Eosinophil #: 0.2 10*3/uL (ref 0.0–0.7)
HCT: 27.9 % — AB (ref 40.0–52.0)
HGB: 9.1 g/dL — AB (ref 13.0–18.0)
Lymphocyte #: 0.5 10*3/uL — ABNORMAL LOW (ref 1.0–3.6)
Lymphocyte %: 4.1 %
MCH: 29.7 pg (ref 26.0–34.0)
MCHC: 32.6 g/dL (ref 32.0–36.0)
MCV: 91 fL (ref 80–100)
MONO ABS: 0.8 x10 3/mm (ref 0.2–1.0)
MONOS PCT: 7 %
Neutrophil #: 9.5 10*3/uL — ABNORMAL HIGH (ref 1.4–6.5)
Neutrophil %: 86.8 %
PLATELETS: 114 10*3/uL — AB (ref 150–440)
RBC: 3.06 10*6/uL — AB (ref 4.40–5.90)
RDW: 23.4 % — ABNORMAL HIGH (ref 11.5–14.5)
WBC: 10.9 10*3/uL — ABNORMAL HIGH (ref 3.8–10.6)

## 2013-07-11 LAB — MAGNESIUM: Magnesium: 1.7 mg/dL — ABNORMAL LOW

## 2013-07-12 LAB — BASIC METABOLIC PANEL
ANION GAP: 4 — AB (ref 7–16)
Anion Gap: 4 — ABNORMAL LOW (ref 7–16)
Anion Gap: 4 — ABNORMAL LOW (ref 7–16)
BUN: 32 mg/dL — ABNORMAL HIGH (ref 7–18)
BUN: 29 mg/dL — ABNORMAL HIGH (ref 7–18)
BUN: 26 mg/dL — AB (ref 7–18)
CALCIUM: 9 mg/dL (ref 8.5–10.1)
CO2: 31 mmol/L (ref 21–32)
CREATININE: 2.59 mg/dL — AB (ref 0.60–1.30)
Calcium, Total: 9 mg/dL (ref 8.5–10.1)
Calcium, Total: 9 mg/dL (ref 8.5–10.1)
Chloride: 100 mmol/L (ref 98–107)
Chloride: 102 mmol/L (ref 98–107)
Chloride: 103 mmol/L (ref 98–107)
Co2: 31 mmol/L (ref 21–32)
Co2: 29 mmol/L (ref 21–32)
Creatinine: 3.1 mg/dL — ABNORMAL HIGH (ref 0.60–1.30)
Creatinine: 2.77 mg/dL — ABNORMAL HIGH (ref 0.60–1.30)
EGFR (African American): 26 — ABNORMAL LOW
EGFR (Non-African Amer.): 18 — ABNORMAL LOW
EGFR (Non-African Amer.): 21 — ABNORMAL LOW
GFR CALC AF AMER: 21 — AB
GFR CALC AF AMER: 24 — AB
GFR CALC NON AF AMER: 23 — AB
GLUCOSE: 222 mg/dL — AB (ref 65–99)
GLUCOSE: 206 mg/dL — AB (ref 65–99)
Glucose: 136 mg/dL — ABNORMAL HIGH (ref 65–99)
OSMOLALITY: 279 (ref 275–301)
OSMOLALITY: 287 (ref 275–301)
OSMOLALITY: 283 (ref 275–301)
POTASSIUM: 4.4 mmol/L (ref 3.5–5.1)
Potassium: 4.3 mmol/L (ref 3.5–5.1)
Potassium: 4.4 mmol/L (ref 3.5–5.1)
SODIUM: 135 mmol/L — AB (ref 136–145)
Sodium: 137 mmol/L (ref 136–145)
Sodium: 136 mmol/L (ref 136–145)

## 2013-07-12 LAB — CBC WITH DIFFERENTIAL/PLATELET
BASOS ABS: 0.1 10*3/uL (ref 0.0–0.1)
Basophil %: 0.9 %
EOS ABS: 0.2 10*3/uL (ref 0.0–0.7)
Eosinophil %: 2.3 %
HCT: 29 % — ABNORMAL LOW (ref 40.0–52.0)
HGB: 9.1 g/dL — AB (ref 13.0–18.0)
Lymphocyte #: 0.5 10*3/uL — ABNORMAL LOW (ref 1.0–3.6)
Lymphocyte %: 4.6 %
MCH: 29.1 pg (ref 26.0–34.0)
MCHC: 31.3 g/dL — AB (ref 32.0–36.0)
MCV: 93 fL (ref 80–100)
MONO ABS: 0.9 x10 3/mm (ref 0.2–1.0)
MONOS PCT: 9.1 %
Neutrophil #: 8.4 10*3/uL — ABNORMAL HIGH (ref 1.4–6.5)
Neutrophil %: 83.1 %
Platelet: 90 10*3/uL — ABNORMAL LOW (ref 150–440)
RBC: 3.12 10*6/uL — AB (ref 4.40–5.90)
RDW: 24.1 % — AB (ref 11.5–14.5)
WBC: 10.1 10*3/uL (ref 3.8–10.6)

## 2013-07-12 LAB — MAGNESIUM: Magnesium: 1.8 mg/dL

## 2013-07-13 LAB — CBC WITH DIFFERENTIAL/PLATELET
BASOS ABS: 0.1 10*3/uL (ref 0.0–0.1)
BASOS PCT: 0.8 %
Eosinophil #: 0.2 10*3/uL (ref 0.0–0.7)
Eosinophil %: 2.1 %
HCT: 27.8 % — ABNORMAL LOW (ref 40.0–52.0)
HGB: 8.7 g/dL — ABNORMAL LOW (ref 13.0–18.0)
LYMPHS ABS: 0.4 10*3/uL — AB (ref 1.0–3.6)
LYMPHS PCT: 4.3 %
MCH: 29.3 pg (ref 26.0–34.0)
MCHC: 31.4 g/dL — AB (ref 32.0–36.0)
MCV: 93 fL (ref 80–100)
Monocyte #: 0.7 x10 3/mm (ref 0.2–1.0)
Monocyte %: 7 %
NEUTROS PCT: 85.8 %
Neutrophil #: 8.5 10*3/uL — ABNORMAL HIGH (ref 1.4–6.5)
Platelet: 58 10*3/uL — ABNORMAL LOW (ref 150–440)
RBC: 2.98 10*6/uL — ABNORMAL LOW (ref 4.40–5.90)
RDW: 23.6 % — AB (ref 11.5–14.5)
WBC: 9.9 10*3/uL (ref 3.8–10.6)

## 2013-07-13 LAB — COMPREHENSIVE METABOLIC PANEL
ALK PHOS: 158 U/L — AB
Albumin: 2.6 g/dL — ABNORMAL LOW (ref 3.4–5.0)
Anion Gap: 5 — ABNORMAL LOW (ref 7–16)
BUN: 21 mg/dL — AB (ref 7–18)
Bilirubin,Total: 0.7 mg/dL (ref 0.2–1.0)
CO2: 29 mmol/L (ref 21–32)
Calcium, Total: 9.1 mg/dL (ref 8.5–10.1)
Chloride: 103 mmol/L (ref 98–107)
Creatinine: 2.18 mg/dL — ABNORMAL HIGH (ref 0.60–1.30)
EGFR (African American): 32 — ABNORMAL LOW
EGFR (Non-African Amer.): 28 — ABNORMAL LOW
Glucose: 172 mg/dL — ABNORMAL HIGH (ref 65–99)
Osmolality: 281 (ref 275–301)
Potassium: 4.3 mmol/L (ref 3.5–5.1)
SGOT(AST): 24 U/L (ref 15–37)
SGPT (ALT): 17 U/L (ref 12–78)
Sodium: 137 mmol/L (ref 136–145)
TOTAL PROTEIN: 5.9 g/dL — AB (ref 6.4–8.2)

## 2013-07-13 LAB — BASIC METABOLIC PANEL
ANION GAP: 1 — AB (ref 7–16)
ANION GAP: 3 — AB (ref 7–16)
Anion Gap: 2 — ABNORMAL LOW (ref 7–16)
Anion Gap: 3 — ABNORMAL LOW (ref 7–16)
Anion Gap: 4 — ABNORMAL LOW (ref 7–16)
BUN: 20 mg/dL — ABNORMAL HIGH (ref 7–18)
BUN: 19 mg/dL — AB (ref 7–18)
BUN: 17 mg/dL (ref 7–18)
BUN: 24 mg/dL — AB (ref 7–18)
BUN: 18 mg/dL (ref 7–18)
CALCIUM: 9.3 mg/dL (ref 8.5–10.1)
CHLORIDE: 105 mmol/L (ref 98–107)
CREATININE: 1.8 mg/dL — AB (ref 0.60–1.30)
CREATININE: 1.89 mg/dL — AB (ref 0.60–1.30)
CREATININE: 2 mg/dL — AB (ref 0.60–1.30)
CREATININE: 2.36 mg/dL — AB (ref 0.60–1.30)
Calcium, Total: 9 mg/dL (ref 8.5–10.1)
Calcium, Total: 9.1 mg/dL (ref 8.5–10.1)
Calcium, Total: 9.2 mg/dL (ref 8.5–10.1)
Calcium, Total: 9.2 mg/dL (ref 8.5–10.1)
Chloride: 104 mmol/L (ref 98–107)
Chloride: 105 mmol/L (ref 98–107)
Chloride: 105 mmol/L (ref 98–107)
Chloride: 106 mmol/L (ref 98–107)
Co2: 30 mmol/L (ref 21–32)
Co2: 31 mmol/L (ref 21–32)
Co2: 31 mmol/L (ref 21–32)
Co2: 32 mmol/L (ref 21–32)
Co2: 33 mmol/L — ABNORMAL HIGH (ref 21–32)
Creatinine: 1.85 mg/dL — ABNORMAL HIGH (ref 0.60–1.30)
EGFR (African American): 29 — ABNORMAL LOW
EGFR (African American): 36 — ABNORMAL LOW
EGFR (African American): 39 — ABNORMAL LOW
EGFR (African American): 41 — ABNORMAL LOW
EGFR (Non-African Amer.): 31 — ABNORMAL LOW
GFR CALC AF AMER: 40 — AB
GFR CALC NON AF AMER: 33 — AB
GFR CALC NON AF AMER: 34 — AB
GFR CALC NON AF AMER: 35 — AB
GFR CALC NON AF AMER: 25 — AB
GLUCOSE: 216 mg/dL — AB (ref 65–99)
Glucose: 151 mg/dL — ABNORMAL HIGH (ref 65–99)
Glucose: 153 mg/dL — ABNORMAL HIGH (ref 65–99)
Glucose: 186 mg/dL — ABNORMAL HIGH (ref 65–99)
Glucose: 217 mg/dL — ABNORMAL HIGH (ref 65–99)
OSMOLALITY: 286 (ref 275–301)
Osmolality: 283 (ref 275–301)
Osmolality: 283 (ref 275–301)
Osmolality: 286 (ref 275–301)
Osmolality: 286 (ref 275–301)
POTASSIUM: 4.6 mmol/L (ref 3.5–5.1)
POTASSIUM: 4.3 mmol/L (ref 3.5–5.1)
POTASSIUM: 4.3 mmol/L (ref 3.5–5.1)
Potassium: 4.4 mmol/L (ref 3.5–5.1)
Potassium: 4.4 mmol/L (ref 3.5–5.1)
SODIUM: 139 mmol/L (ref 136–145)
SODIUM: 138 mmol/L (ref 136–145)
SODIUM: 139 mmol/L (ref 136–145)
Sodium: 139 mmol/L (ref 136–145)
Sodium: 140 mmol/L (ref 136–145)

## 2013-07-13 LAB — MAGNESIUM: Magnesium: 1.5 mg/dL — ABNORMAL LOW

## 2013-07-14 LAB — BASIC METABOLIC PANEL
ANION GAP: 3 — AB (ref 7–16)
Anion Gap: 5 — ABNORMAL LOW (ref 7–16)
Anion Gap: 2 — ABNORMAL LOW (ref 7–16)
Anion Gap: 4 — ABNORMAL LOW (ref 7–16)
BUN: 17 mg/dL (ref 7–18)
BUN: 18 mg/dL (ref 7–18)
BUN: 16 mg/dL (ref 7–18)
BUN: 15 mg/dL (ref 7–18)
CALCIUM: 9 mg/dL (ref 8.5–10.1)
CALCIUM: 9 mg/dL (ref 8.5–10.1)
CHLORIDE: 105 mmol/L (ref 98–107)
CHLORIDE: 106 mmol/L (ref 98–107)
CO2: 30 mmol/L (ref 21–32)
CO2: 30 mmol/L (ref 21–32)
CREATININE: 1.65 mg/dL — AB (ref 0.60–1.30)
CREATININE: 1.5 mg/dL — AB (ref 0.60–1.30)
Calcium, Total: 8.9 mg/dL (ref 8.5–10.1)
Calcium, Total: 9 mg/dL (ref 8.5–10.1)
Chloride: 106 mmol/L (ref 98–107)
Chloride: 107 mmol/L (ref 98–107)
Co2: 29 mmol/L (ref 21–32)
Co2: 31 mmol/L (ref 21–32)
Creatinine: 1.77 mg/dL — ABNORMAL HIGH (ref 0.60–1.30)
Creatinine: 1.59 mg/dL — ABNORMAL HIGH (ref 0.60–1.30)
EGFR (African American): 45 — ABNORMAL LOW
EGFR (African American): 47 — ABNORMAL LOW
EGFR (African American): 51 — ABNORMAL LOW
EGFR (Non-African Amer.): 36 — ABNORMAL LOW
EGFR (Non-African Amer.): 44 — ABNORMAL LOW
GFR CALC AF AMER: 42 — AB
GFR CALC NON AF AMER: 39 — AB
GFR CALC NON AF AMER: 41 — AB
GLUCOSE: 181 mg/dL — AB (ref 65–99)
GLUCOSE: 179 mg/dL — AB (ref 65–99)
Glucose: 177 mg/dL — ABNORMAL HIGH (ref 65–99)
Glucose: 220 mg/dL — ABNORMAL HIGH (ref 65–99)
OSMOLALITY: 280 (ref 275–301)
OSMOLALITY: 283 (ref 275–301)
OSMOLALITY: 289 (ref 275–301)
Osmolality: 287 (ref 275–301)
Potassium: 4.4 mmol/L (ref 3.5–5.1)
Potassium: 4.2 mmol/L (ref 3.5–5.1)
Potassium: 4.2 mmol/L (ref 3.5–5.1)
Potassium: 4.4 mmol/L (ref 3.5–5.1)
SODIUM: 141 mmol/L (ref 136–145)
Sodium: 141 mmol/L (ref 136–145)
Sodium: 137 mmol/L (ref 136–145)
Sodium: 139 mmol/L (ref 136–145)

## 2013-07-14 LAB — MAGNESIUM
MAGNESIUM: 2 mg/dL
Magnesium: 1.6 mg/dL — ABNORMAL LOW

## 2013-07-14 LAB — CULTURE, BLOOD (SINGLE)

## 2013-07-14 LAB — PHOSPHORUS
PHOSPHORUS: 1.9 mg/dL — AB (ref 2.5–4.9)
Phosphorus: 1.6 mg/dL — ABNORMAL LOW (ref 2.5–4.9)

## 2013-07-15 LAB — BASIC METABOLIC PANEL WITH GFR
Anion Gap: 0 — ABNORMAL LOW
Anion Gap: 3 — ABNORMAL LOW
BUN: 13 mg/dL
BUN: 13 mg/dL
Calcium, Total: 9.2 mg/dL
Calcium, Total: 8.9 mg/dL
Chloride: 106 mmol/L
Chloride: 107 mmol/L
Co2: 30 mmol/L
Co2: 30 mmol/L
Creatinine: 1.24 mg/dL
Creatinine: 1.19 mg/dL
EGFR (African American): >60
EGFR (African American): >60
EGFR (Non-African Amer.): 55 — ABNORMAL LOW
EGFR (Non-African Amer.): 58 — ABNORMAL LOW
Glucose: 169 mg/dL — ABNORMAL HIGH
Glucose: 191 mg/dL — ABNORMAL HIGH
Osmolality: 276
Osmolality: 285
Potassium: 4.4 mmol/L
Potassium: 4.4 mmol/L
Sodium: 136 mmol/L
Sodium: 140 mmol/L

## 2013-07-15 LAB — BASIC METABOLIC PANEL
ANION GAP: 3 — AB (ref 7–16)
ANION GAP: 4 — AB (ref 7–16)
BUN: 13 mg/dL (ref 7–18)
BUN: 14 mg/dL (ref 7–18)
CHLORIDE: 105 mmol/L (ref 98–107)
CHLORIDE: 106 mmol/L (ref 98–107)
CO2: 29 mmol/L (ref 21–32)
CREATININE: 1.31 mg/dL — AB (ref 0.60–1.30)
CREATININE: 1.32 mg/dL — AB (ref 0.60–1.30)
Calcium, Total: 9.1 mg/dL (ref 8.5–10.1)
Calcium, Total: 9.1 mg/dL (ref 8.5–10.1)
Co2: 31 mmol/L (ref 21–32)
EGFR (African American): 59 — ABNORMAL LOW
EGFR (African American): >60
GFR CALC NON AF AMER: 51 — AB
GFR CALC NON AF AMER: 52 — AB
Glucose: 160 mg/dL — ABNORMAL HIGH (ref 65–99)
Glucose: 180 mg/dL — ABNORMAL HIGH (ref 65–99)
OSMOLALITY: 286 (ref 275–301)
Osmolality: 277 (ref 275–301)
POTASSIUM: 4.8 mmol/L (ref 3.5–5.1)
Potassium: 4.6 mmol/L (ref 3.5–5.1)
SODIUM: 141 mmol/L (ref 136–145)
Sodium: 137 mmol/L (ref 136–145)

## 2013-07-15 LAB — MAGNESIUM
Magnesium: 1.7 mg/dL — ABNORMAL LOW
Magnesium: 1.9 mg/dL

## 2013-07-15 LAB — PHOSPHORUS
Phosphorus: 1.8 mg/dL — ABNORMAL LOW (ref 2.5–4.9)
Phosphorus: 2 mg/dL — ABNORMAL LOW
Phosphorus: 2 mg/dL — ABNORMAL LOW (ref 2.5–4.9)

## 2013-07-16 LAB — BASIC METABOLIC PANEL
ANION GAP: 2 — AB (ref 7–16)
ANION GAP: 2 — AB (ref 7–16)
Anion Gap: 4 — ABNORMAL LOW (ref 7–16)
Anion Gap: 3 — ABNORMAL LOW (ref 7–16)
BUN: 12 mg/dL (ref 7–18)
BUN: 11 mg/dL (ref 7–18)
BUN: 12 mg/dL (ref 7–18)
BUN: 13 mg/dL (ref 7–18)
CALCIUM: 9.1 mg/dL (ref 8.5–10.1)
CALCIUM: 8.9 mg/dL (ref 8.5–10.1)
CHLORIDE: 105 mmol/L (ref 98–107)
CHLORIDE: 105 mmol/L (ref 98–107)
CO2: 31 mmol/L (ref 21–32)
CREATININE: 1.2 mg/dL (ref 0.60–1.30)
CREATININE: 1.26 mg/dL (ref 0.60–1.30)
Calcium, Total: 9 mg/dL (ref 8.5–10.1)
Calcium, Total: 9 mg/dL (ref 8.5–10.1)
Chloride: 107 mmol/L (ref 98–107)
Chloride: 105 mmol/L (ref 98–107)
Co2: 30 mmol/L (ref 21–32)
Co2: 30 mmol/L (ref 21–32)
Co2: 31 mmol/L (ref 21–32)
Creatinine: 1.15 mg/dL (ref 0.60–1.30)
Creatinine: 1.17 mg/dL (ref 0.60–1.30)
EGFR (African American): >60
EGFR (African American): >60
EGFR (African American): >60
EGFR (African American): >60
EGFR (Non-African Amer.): 54 — ABNORMAL LOW
EGFR (Non-African Amer.): 58 — ABNORMAL LOW
EGFR (Non-African Amer.): 59 — ABNORMAL LOW
GLUCOSE: 164 mg/dL — AB (ref 65–99)
GLUCOSE: 138 mg/dL — AB (ref 65–99)
GLUCOSE: 169 mg/dL — AB (ref 65–99)
Glucose: 147 mg/dL — ABNORMAL HIGH (ref 65–99)
OSMOLALITY: 278 (ref 275–301)
OSMOLALITY: 279 (ref 275–301)
Osmolality: 282 (ref 275–301)
Osmolality: 281 (ref 275–301)
POTASSIUM: 4.5 mmol/L (ref 3.5–5.1)
Potassium: 4.7 mmol/L (ref 3.5–5.1)
Potassium: 4.6 mmol/L (ref 3.5–5.1)
Potassium: 4.5 mmol/L (ref 3.5–5.1)
SODIUM: 139 mmol/L (ref 136–145)
SODIUM: 140 mmol/L (ref 136–145)
Sodium: 138 mmol/L (ref 136–145)
Sodium: 138 mmol/L (ref 136–145)

## 2013-07-16 LAB — CBC WITH DIFFERENTIAL/PLATELET
Basophil #: 0.1 10*3/uL (ref 0.0–0.1)
Basophil %: 1.1 %
Eosinophil #: 0.1 10*3/uL (ref 0.0–0.7)
Eosinophil %: 1.7 %
HCT: 28.5 % — ABNORMAL LOW (ref 40.0–52.0)
HGB: 8.8 g/dL — AB (ref 13.0–18.0)
LYMPHS ABS: 0.6 10*3/uL — AB (ref 1.0–3.6)
Lymphocyte %: 7.1 %
MCH: 29.3 pg (ref 26.0–34.0)
MCHC: 31 g/dL — ABNORMAL LOW (ref 32.0–36.0)
MCV: 95 fL (ref 80–100)
Monocyte #: 0.8 x10 3/mm (ref 0.2–1.0)
Monocyte %: 9.8 %
NEUTROS ABS: 6.8 10*3/uL — AB (ref 1.4–6.5)
Neutrophil %: 80.3 %
PLATELETS: 47 10*3/uL — AB (ref 150–440)
RBC: 3 10*6/uL — AB (ref 4.40–5.90)
RDW: 26.6 % — ABNORMAL HIGH (ref 11.5–14.5)
WBC: 8.5 10*3/uL (ref 3.8–10.6)

## 2013-07-16 LAB — MAGNESIUM: Magnesium: 1.8 mg/dL

## 2013-07-17 LAB — BASIC METABOLIC PANEL
Anion Gap: 1 — ABNORMAL LOW (ref 7–16)
Anion Gap: 3 — ABNORMAL LOW (ref 7–16)
Anion Gap: 4 — ABNORMAL LOW (ref 7–16)
BUN: 13 mg/dL (ref 7–18)
BUN: 11 mg/dL (ref 7–18)
BUN: 13 mg/dL (ref 7–18)
CO2: 30 mmol/L (ref 21–32)
CREATININE: 1.06 mg/dL (ref 0.60–1.30)
CREATININE: 1.42 mg/dL — AB (ref 0.60–1.30)
Calcium, Total: 8.9 mg/dL (ref 8.5–10.1)
Calcium, Total: 9.1 mg/dL (ref 8.5–10.1)
Calcium, Total: 9.1 mg/dL (ref 8.5–10.1)
Chloride: 105 mmol/L (ref 98–107)
Chloride: 106 mmol/L (ref 98–107)
Chloride: 107 mmol/L (ref 98–107)
Co2: 31 mmol/L (ref 21–32)
Co2: 33 mmol/L — ABNORMAL HIGH (ref 21–32)
Creatinine: 1.08 mg/dL (ref 0.60–1.30)
EGFR (African American): 54 — ABNORMAL LOW
EGFR (African American): >60
EGFR (African American): >60
EGFR (Non-African Amer.): >60
EGFR (Non-African Amer.): 47 — ABNORMAL LOW
GLUCOSE: 154 mg/dL — AB (ref 65–99)
GLUCOSE: 186 mg/dL — AB (ref 65–99)
Glucose: 135 mg/dL — ABNORMAL HIGH (ref 65–99)
OSMOLALITY: 286 (ref 275–301)
Osmolality: 283 (ref 275–301)
Osmolality: 279 (ref 275–301)
POTASSIUM: 4.6 mmol/L (ref 3.5–5.1)
Potassium: 4.4 mmol/L (ref 3.5–5.1)
Potassium: 4.7 mmol/L (ref 3.5–5.1)
SODIUM: 138 mmol/L (ref 136–145)
Sodium: 141 mmol/L (ref 136–145)
Sodium: 141 mmol/L (ref 136–145)

## 2013-07-17 LAB — MAGNESIUM: Magnesium: 1.8 mg/dL

## 2013-07-18 LAB — BASIC METABOLIC PANEL
Anion Gap: 2 — ABNORMAL LOW (ref 7–16)
Anion Gap: 5 — ABNORMAL LOW (ref 7–16)
BUN: 19 mg/dL — AB (ref 7–18)
BUN: 23 mg/dL — AB (ref 7–18)
CHLORIDE: 106 mmol/L (ref 98–107)
CO2: 29 mmol/L (ref 21–32)
Calcium, Total: 9 mg/dL (ref 8.5–10.1)
Calcium, Total: 9 mg/dL (ref 8.5–10.1)
Chloride: 106 mmol/L (ref 98–107)
Co2: 30 mmol/L (ref 21–32)
Creatinine: 1.78 mg/dL — ABNORMAL HIGH (ref 0.60–1.30)
Creatinine: 2.11 mg/dL — ABNORMAL HIGH (ref 0.60–1.30)
EGFR (African American): 34 — ABNORMAL LOW
EGFR (African American): 41 — ABNORMAL LOW
EGFR (Non-African Amer.): 36 — ABNORMAL LOW
GFR CALC NON AF AMER: 29 — AB
Glucose: 130 mg/dL — ABNORMAL HIGH (ref 65–99)
Glucose: 190 mg/dL — ABNORMAL HIGH (ref 65–99)
OSMOLALITY: 289 (ref 275–301)
Osmolality: 279 (ref 275–301)
POTASSIUM: 4.8 mmol/L (ref 3.5–5.1)
Potassium: 5 mmol/L (ref 3.5–5.1)
SODIUM: 137 mmol/L (ref 136–145)
Sodium: 141 mmol/L (ref 136–145)

## 2013-07-19 LAB — BASIC METABOLIC PANEL
ANION GAP: 4 — AB (ref 7–16)
ANION GAP: 3 — AB (ref 7–16)
BUN: 19 mg/dL — ABNORMAL HIGH (ref 7–18)
BUN: 19 mg/dL — AB (ref 7–18)
CHLORIDE: 105 mmol/L (ref 98–107)
CHLORIDE: 107 mmol/L (ref 98–107)
CO2: 31 mmol/L (ref 21–32)
CREATININE: 1.65 mg/dL — AB (ref 0.60–1.30)
CREATININE: 1.63 mg/dL — AB (ref 0.60–1.30)
Calcium, Total: 9.1 mg/dL (ref 8.5–10.1)
Calcium, Total: 8.8 mg/dL (ref 8.5–10.1)
Co2: 28 mmol/L (ref 21–32)
EGFR (African American): 45 — ABNORMAL LOW
EGFR (Non-African Amer.): 39 — ABNORMAL LOW
GFR CALC AF AMER: 46 — AB
GFR CALC NON AF AMER: 40 — AB
Glucose: 129 mg/dL — ABNORMAL HIGH (ref 65–99)
Glucose: 135 mg/dL — ABNORMAL HIGH (ref 65–99)
OSMOLALITY: 278 (ref 275–301)
OSMOLALITY: 286 (ref 275–301)
Potassium: 4.7 mmol/L (ref 3.5–5.1)
Potassium: 4.9 mmol/L (ref 3.5–5.1)
Sodium: 137 mmol/L (ref 136–145)
Sodium: 141 mmol/L (ref 136–145)

## 2013-07-19 LAB — CBC WITH DIFFERENTIAL/PLATELET
BASOS ABS: 0.1 10*3/uL (ref 0.0–0.1)
Basophil %: 0.8 %
EOS ABS: 0.1 10*3/uL (ref 0.0–0.7)
Eosinophil %: 1.7 %
HCT: 28.2 % — ABNORMAL LOW (ref 40.0–52.0)
HGB: 8.6 g/dL — ABNORMAL LOW (ref 13.0–18.0)
LYMPHS ABS: 0.6 10*3/uL — AB (ref 1.0–3.6)
LYMPHS PCT: 7.5 %
MCH: 29.8 pg (ref 26.0–34.0)
MCHC: 30.5 g/dL — ABNORMAL LOW (ref 32.0–36.0)
MCV: 98 fL (ref 80–100)
MONOS PCT: 8.6 %
Monocyte #: 0.7 x10 3/mm (ref 0.2–1.0)
NEUTROS PCT: 81.4 %
Neutrophil #: 6.8 10*3/uL — ABNORMAL HIGH (ref 1.4–6.5)
Platelet: 56 10*3/uL — ABNORMAL LOW (ref 150–440)
RBC: 2.89 10*6/uL — ABNORMAL LOW (ref 4.40–5.90)
RDW: 26.6 % — ABNORMAL HIGH (ref 11.5–14.5)
WBC: 8.3 10*3/uL (ref 3.8–10.6)

## 2013-08-01 ENCOUNTER — Ambulatory Visit: Payer: Self-pay | Admitting: Internal Medicine

## 2013-08-01 DEATH — deceased

## 2013-08-13 ENCOUNTER — Telehealth: Payer: Self-pay

## 2013-08-13 NOTE — Telephone Encounter (Signed)
Patient past away per [facility]  °

## 2014-10-21 NOTE — Discharge Summary (Signed)
PATIENT NAME:  Jerry Allen, Jerry Allen MR#:  [id number] DATE OF BIRTH:  May 16, 1933  DATE OF ADMISSION:  07/13/2012 DATE OF DISCHARGE:  07/27/2012  DISCHARGE DIAGNOSES: 1. Secondary bacterial peritonitis, due to enterococcus, Escherichia coli and pseudomonas.   2. End-stage renal disease, hemodialysis.  3. Systemic hypertension.  4. Chronic atrial fibrillation, on Coumadin.  5. Coronary artery disease.  6. Diabetes mellitus, insulin requiring.  7. Hyperlipidemia.   DISCHARGE MEDICATIONS: 1. Crestor 20 mg at bedtime.  2. Lantus Solo Star insulin 30 units twice daily.  3. Torsemide 20 mg 2 times a day. 4. Aspirin 81 mg daily.  5. Vitamin D3 1000 units daily.  6. Metoprolol tartrate 50 mg 2 times a day. 7. Centrum Silver daily.  8. Protonix 40 mg daily.  9. Calcium acetate 667 mg 3 tabs 3 times daily. 10. Enalapril 10 mg daily.  11. Sucralfate 1 gram 3 times daily. 12. Calcitrol 0.25 mcg daily.  13. Warfarin 3 mg at bedtime alternating with 1.5 mg at bedtime every other day.  14. Ciprofloxacin 500 mg post dialysis.    REASON FOR ADMISSION: This is a 81 year old male who presents with severe right lower quadrant abdominal pain. Please see H and P for HPI, past medical history and physical exam.   HOSPITAL COURSE: The patient was admitted. Initially he had profound constipation. His bowels moved and things improved somewhat. His peritoneal white count was 10,000 and then dropped to 1000 and then was back up. Cultures grew enterococcus, Escherichia coli and pseudomonas. He ultimately went on meropenem daily with p.o. Cipro to cover the pseudomonas. Dr. Sampson GoonFitzgerald of infectious disease was consulted and suggested the antibiotic coverage. He dramatically improved over the hospital course. His PD catheter was taken out and 48 hours later a PermCath was placed for hemodialysis. He will remain on hemodialysis for 6 months before consideration of peritoneal dialysis. He will continue on p.o. Cipro, post  dialysis, for 3 weeks, and will be getting ceftazidime at the dialysis center. He will follow up with  Dr. Sampson GoonFitzgerald and Dr. Cherylann RatelLateef of renal. Overall prognosis is guarded. He still has significant fluid to be withdrawn and may need oxygen at home for a while. ____________________________ Danella PentonMark F. Keerstin Bjelland, MD mfm:sb D: 07/27/2012 08:16:05 ET T: 07/27/2012 08:24:20 ET JOB#: [id number]  cc: Danella PentonMark F. Nicasio Barlowe, MD, <Dictator> Poet Hineman Sherlene ShamsF Ryleah Miramontes MD ELECTRONICALLY SIGNED 07/27/2012 16:33

## 2014-10-21 NOTE — H&P (Signed)
PATIENT NAME:  Jerry Allen, Jerry Allen MR#:  [id number] DATE OF BIRTH:  Nov 13, 1932  DATE OF ADMISSION:  07/14/2012  PRIMARY CARE PHYSICIAN: Danella Penton, MD   REFERRING PHYSICIAN: Renford Dills, NP. She is covered by Maricela Bo, MD.    CHIEF COMPLAINT: Abdominal pain.   HISTORY OF PRESENT ILLNESS: The patient is a 81 year old pleasant Caucasian male with history of end-stage renal disease, on peritoneal dialysis, he does that at home, history of systemic hypertension, chronic atrial fibrillation, on chronic anticoagulation with Coumadin. He was in his usual state of health until the last 24 hours, when he started to have abdominal pain at the central abdominal area, but more so towards the right. The pain was described as sharp pain and constant, and it is tender to touch. The severity of the pain increases upon palpation. He had several episodes of dry heaving and vomiting, and he could not eat or drink, but he was able to take his Lantus insulin only. The patient was seen here at the Emergency Department, and evaluation was suspicious for possible acute peritonitis. Additionally, there is minimal elevation of troponin at 0.15; however, in the past records, the patient did have, in the past, elevated troponin. The patient denies having any chest pain.   REVIEW OF SYSTEMS:   CONSTITUTIONAL: Denies any fever. No chills. No fatigue.  EYES: No blurring of vision and no double vision. Left pupil is small but round, which is the good eye. In the right eye, pupil is irregular and he has diminished vision. He had multiple surgeries to this eye.  ENT: No hearing impairment. No sore throat. No dysphagia.  CARDIOVASCULAR: Denies any chest pain. He reports mild shortness of breath, if any. No edema. No syncope.  RESPIRATORY: No cough. No sputum production. No chest pain. No hemoptysis.  GASTROINTESTINAL: Reports abdominal pain, nausea and vomiting as above. No melena. No hematochezia.  GENITOURINARY: No dysuria.  No frequency of urination.  MUSCULOSKELETAL: No joint pain or swelling. No muscular pain or swelling.  INTEGUMENTARY: No skin rash. No ulcers.  NEUROLOGY: No focal weakness. No seizure activity. No headache.  PSYCHIATRY: No anxiety and no depression.  ENDOCRINE: No polyuria or polydipsia. No heat or cold intolerance.  HEMATOLOGY: No easy bruisability. No lymph node enlargement.   PAST MEDICAL HISTORY:  1. End-stage renal disease, on peritoneal dialysis.  2. Severe systemic hypertension.  3. Chronic atrial fibrillation with chronic anticoagulation with Coumadin.  4. Anemia of chronic disease.  5. Severe systemic hypertension.  6. History of coronary artery disease, status post myocardial infarction.  7. There is also a question about heart valve replacement in the past. This is not confirmed or clarified.  8. The patient also has diabetes mellitus type 2, on Lantus insulin.  9. Hypercholesterolemia.   PAST SURGICAL HISTORY: Coronary artery bypass graft in 2004. Multiple surgeries to the right eye, including for muscular degeneration, cataract and detached retina. Remote history of surgery for pilonidal cyst in 1956. Knee joint replacement.   SOCIAL HISTORY: He is married, living with his wife. 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   SOCIAL HABITS: Ex-chronic smoker. He quit 10 years ago after having his heart attack. He has a total history of 40-pack-year smoking. No history of alcohol or other drug abuse.   FAMILY HISTORY: His mother died from complications of dementia. His father died of congestive heart failure at the age of 79.   ADMISSION MEDICATIONS:  1. Aspirin 81 mg  a day.  2. Calcitriol 0.25 mcg once a day.  3. Calcium acetate 667 mg 3 capsules 3 times a day.  4. Centrum Silver 1 tablet once a day. 5. Crestor 20 mg once a day.  6. Enalapril 10 mg once a day. 7. Lantus 30 units twice a day.  8. Metoprolol tartrate 50 mg twice a day. 9.  Mucinex 600 mg once a day.  10. Pantoprazole 40 mg once a day.  11. Sucralfate 1 mg 3 times a day.  12. Torsemide 20 mg twice a day.  13. Vitamin D3 1000 units once a day.  14. Warfarin 3 mg every other day and 1.5 mg every other day.   ALLERGIES: IRON CAUSING SIDE EFFECTS INCLUDING NAUSEA, VOMITING AND GI DISTRESS.   PHYSICAL EXAMINATION:  VITAL SIGNS: His blood pressure 87/48, respiratory rate 20, pulse 82, temperature 98.3, oxygen saturation 99%.  GENERAL APPEARANCE: Elderly male, lying in bed in no acute distress.  HEENT: No pallor. No icterus. No cyanosis. Ear examination revealed normal hearing, no discharge, no lesions. Nasal mucosa was normal, no ulcers, no discharge. Lips and oral cavity were normal, no discharge, no oral thrush, no ulcers. Eye examination revealed normal eyelids and conjunctivae. The right pupil is irregular and does not react to the light. The left pupil is constricted.  NECK: Supple. Trachea at midline. No thyromegaly. No cervical lymphadenopathy. No masses.  HEART: Revealed normal S1, S2. No S3 or S4. No gallop. No murmur. No carotid bruits.  RESPIRATORY: Showed he had normal breathing pattern without use of accessory muscles. No rales. No wheezing.  ABDOMEN: Obese, soft, with tenderness at the central abdominal area, more so towards the right side of the abdomen. No rigidity. No hernias. The peritoneal dialysis catheter is located at the mid abdomen, and the area looks clean.  MUSCULOSKELETAL: No joint swelling. No clubbing.  SKIN: Revealed no ulcers. No subcutaneous nodules.  NEUROLOGIC: Cranial nerves II through XII are intact. No focal motor deficit.  PSYCHIATRIC: The patient is alert and oriented x3. Mood and affect were normal.   LABORATORY FINDINGS: CT scan of the abdomen showed findings suspicious for early mild inflammatory changes involving the hepatic flexure. There is cardiomegaly, and component of pulmonary edema is raised. Small left effusion. Some  atelectasis. Infrarenal abdominal aortic aneurysm measured 4.6 x 4.4. No evidence of bowel obstruction. No evidence of drainable loculated fluid collection. Chest x-ray showed pulmonary vascular prominence and mild right-sided pleural effusion. Possibly left pleural effusion as well. Serum glucose 199. B-type natriuretic peptide was 32,467. BUN 103, creatinine 10, sodium 136, potassium 3.5. Total protein 7.8, albumin 2.8, normal liver transaminases. Bilirubin 0.5. Troponin is elevated at 0.15. CBC showed a white count of 9000, hemoglobin 10, hematocrit 32, platelet count 183. Prothrombin time 23, INR 2.1. Influenza A and B were negative.   ASSESSMENT:  1. Abdominal pain associated with nausea and vomiting, with suspicion for acute peritonitis secondary to the peritoneal dialysis catheter usage.  2. Elevated troponin. I am not sure about the etiology of that, whether this is related to his renal failure or we are dealing with coronary event. Nevertheless, will follow up on the troponin. The patient is asymptomatic in terms of chest pain.  3. End-stage renal disease, on peritoneal dialysis. 4. Chronic atrial fibrillation.  5. Chronic anticoagulation with Coumadin.  6. Pulmonary vascular congestion, possibly early pulmonary edema. 7. Hypotension. This is either related to sepsis versus cardiac in origin.  8. Coronary artery disease status post coronary artery  bypass graft.  9. History of systemic hypertension.  10. Infrarenal abdominal aortic aneurysm measured 4.6 x 4.4.  11. Anemia of chronic diseases.  PLAN: Will admit the patient to the intensive care unit. Blood cultures were taken. IV antibiotic will be started using vancomycin and Zosyn. Unfortunately, peritoneal fluid was not obtained due to lack of trained nurse to do that, as I was told. Nephrology consultation was obtained, and Dr. Thedore MinsSingh came here to see the patient, and he will see him again in the morning. In any way, blood cultures were  taken. Fluid challenge was done at the Emergency Department to improve blood pressure. His latest blood pressure is above 90. I will follow on his troponin level. Cardiology consultation with Dr. Lady GaryFath. Nephrology consult with Dr. Thedore MinsSingh. Due to hypotension, I will hold enalapril, metoprolol and torsemide. Due to the patient's inability to eat or drink due to vomiting and abdominal pain, I will hold Lantus, but I will place him on Accu-Chek and sliding scale. For deep vein thrombosis prophylaxis, the patient is already on anticoagulation with Coumadin.   TIME SPENT IN EVALUATING THIS PATIENT: Took more than 1 hour due to its complexity and reviewing medical records.    ____________________________ Carney CornersAmir M. Rudene Rearwish, MD amd:OSi D: 07/13/2012 23:53:20 ET T: 07/14/2012 06:29:22 ET JOB#: [id number]  cc: Carney CornersAmir M. Rudene Rearwish, MD, <Dictator> Karolee OhsAMIR Dala DockM Ilan Kahrs MD ELECTRONICALLY SIGNED 07/14/2012 7:23

## 2014-10-21 NOTE — Consult Note (Signed)
Chief Complaint:  Subjective/Chief Complaint Doing well today. No worsening pain or sob. Ready for procedure.   VITAL SIGNS/ANCILLARY NOTES: **Vital Signs.:   04-Dec-14 10:21  Vital Signs Type Q 4hr  Temperature Temperature (F) 98.3  Celsius 36.8  Pulse Pulse 80  Respirations Respirations 18  Systolic BP Systolic BP 97  Diastolic BP (mmHg) Diastolic BP (mmHg) 62  Mean BP 73  Pulse Ox % Pulse Ox % 97  Pulse Ox Activity Level  At rest  Oxygen Delivery 2L  *Intake and Output.:   Daily 04-Dec-14 07:00  Grand Totals Intake:  1260 Output:  50    Net:  1210 24 Hr.:  1210  Oral Intake      In:  960  IV (Secondary)      In:  50  Total Intake per OR/Specials/BirthPlace      In:  250  Urine ml     Out:  50  Length of Stay Totals Intake:  2190 Output:  50    Net:  2140   Brief Assessment:  GEN well developed, well nourished, no acute distress, obese   Cardiac Irregular  murmur present  -- LE edema  -- JVD   Respiratory normal resp effort  clear BS   Gastrointestinal Normal   Gastrointestinal details normal Soft   Gastrointestinal details abnormal Minimum Tenderness   Tenderness RLQ   EXTR negative cyanosis/clubbing, negative edema   Lab Results: Hepatic:  04-Dec-14 04:26   Bilirubin, Total 0.7  Alkaline Phosphatase 91 (45-117 NOTE: New Reference Range 05/21/13)  SGPT (ALT) 12  SGOT (AST)  11  Total Protein, Serum  5.7  Albumin, Serum  2.1  Routine Micro:  04-Dec-14 15:45   Organism Name GRAM NEGATIVE ROD  Organism Quantity HEAVY GROWTH  Micro Text Report MISC AER/ANAEROBIC CULT.   ORGANISM 1                HEAVY GROWTH GRAM NEGATIVE ROD   COMMENT                   ID TO FOLLOW SENSITIVITIES TO FOLLOW   GRAM STAIN                MANY WHITE BLOOD CELLS   GRAM STAIN                MANY GRAM POSITIVECOCCI IN CHAINS   GRAM STAIN                MODERATE GRAM NEGATIVE ROD   ANTIBIOTIC                       Specimen Source FLUID  Organism 1 HEAVY GROWTH GRAM  NEGATIVE ROD  Culture Comment ID TO FOLLOW SENSITIVITIES TO FOLLOW  Gram Stain 1 MANY WHITE BLOOD CELLS  Gram Stain 2 MANY GRAM POSITIVE COCCI IN CHAINS  Gram Stain 3 MODERATE GRAM NEGATIVE ROD  Result(s) reported on 04 Jun 2013 at 11:22AM.  Lab:  04-Dec-14 19:50   pH (ABG)  7.48  PCO2 39  PO2  43  FiO2 36  Base Excess  5.2  HCO3  29.0  O2 Saturation  71.0  O2 Device CANNULA  Specimen Site (ABG) LT RADIAL  Specimen Type (ABG) ARTERIAL  Patient Temp (ABG) 37.0  Routine Chem:  04-Dec-14 04:26   Glucose, Serum  111  BUN  37  Creatinine (comp)  4.44  Sodium, Serum 137  Potassium, Serum 3.8  Chloride, Serum  96  CO2, Serum  34  Calcium (Total), Serum 8.9  eGFR (African American)  14  eGFR (Non-African American)  12 (eGFR values <70m/min/1.73 m2 may be an indication of chronic kidney disease (CKD). Calculated eGFR is useful in patients with stable renal function. The eGFR calculation will not be reliable in acutely ill patients when serum creatinine is changing rapidly. It is not useful in  patients on dialysis. The eGFR calculation may not be applicable to patients at the low and high extremes of body sizes, pregnant women, and vegetarians.)  Anion Gap 7    19:50   Result Comment - NOTIFIED OF CRITICAL VALUE  - HAND DELIVERED  - suspect venous sample  - Dr. EPat Patrickat 2000 on 06/03/13  Result(s) reported on 03 Jun 2013 at 08:12PM.  Routine Coag:  04-Dec-14 04:26   Prothrombin  19.7  INR 1.7 (INR reference interval applies to patients on anticoagulant therapy. A single INR therapeutic range for coumarins is not optimal for all indications; however, the suggested range for most indications is 2.0 - 3.0. Exceptions to the INR Reference Range may include: Prosthetic heart valves, acute myocardial infarction, prevention of myocardial infarction, and combinations of aspirin and anticoagulant. The need for a higher or lower target INR must be assessed individually. Reference:  The Pharmacology and Management of the Vitamin K  antagonists: the seventh ACCP Conference on Antithrombotic and Thrombolytic Therapy. CYIAXK.5537Sept:126 (3suppl): [id number] A HCT value >55% may artifactually increase the PT.  In one study,  the increase was an average of 25%. Reference:  "Effect on Routine and Special Coagulation Testing Values of Citrate Anticoagulant Adjustment in Patients with High HCT Values." American Journal of Clinical Pathology 2006;126:400-405.)  Routine Hem:  04-Dec-14 04:26   WBC (CBC) 8.6  RBC (CBC)  3.34  Hemoglobin (CBC)  9.1  Hematocrit (CBC)  28.5  Platelet Count (CBC)  118  MCV 85  MCH 27.3  MCHC 32.0  RDW  20.4  Neutrophil % 84.3  Lymphocyte % 4.0  Monocyte % 10.0  Eosinophil % 1.2  Basophil % 0.5  Neutrophil #  7.3  Lymphocyte #  0.3  Monocyte # 0.9  Eosinophil # 0.1  Basophil # 0.0 (Result(s) reported on 03 Jun 2013 at 05:01AM.)   Assessment/Plan:  Invasive Device Daily Assessment of Necessity:  Does the patient currently have any of the following indwelling devices? foley  none   Indwelling Urinary Catheter continued, requirement due to   Reason to continue Indwelling Urinary Catheter preop or postop order according to surgical protocols   Assessment/Plan:  Assessment IMP Pre-op surgical procedure today Appx Abscess AFIB CM Hx CHF ESRD Cellulitis legs chronic CAD HTN ..Marland Kitchen  Plan PLAN Heparin for anticoug pre-op Continue to hold coumadin Agree with antibx pre-op Dialysis today for ESRD Continue Bp control Pain controo with Morphine Continue CM meds Compensated heart failure stable Hopefully drainage of abscess today   Electronic Signatures: CLujean AmelD (MD)  (Signed 05-Dec-14 11:33)  Authored: Chief Complaint, VITAL SIGNS/ANCILLARY NOTES, Brief Assessment, Lab Results, Assessment/Plan   Last Updated: 05-Dec-14 11:33 by CYolonda Kida(MD)

## 2014-10-21 NOTE — Consult Note (Signed)
Chief Complaint:  Subjective/Chief Complaint Pt had severe abd pain last pm and needed morphine sulfate x 2. He feels better today. Awaiting dialysis today.   VITAL SIGNS/ANCILLARY NOTES: **Vital Signs.:   03-Dec-14 05:35  Vital Signs Type Routine  Temperature Temperature (F) 97.3  Celsius 36.2  Temperature Source oral  Pulse Pulse 74  Respirations Respirations 18  Systolic BP Systolic BP 161  Diastolic BP (mmHg) Diastolic BP (mmHg) 62  Mean BP 76  Pulse Ox Activity Level  At rest  Oxygen Delivery 2L  *Intake and Output.:   Daily 03-Dec-14 07:00  Grand Totals Intake:  930 Output:      Net:  43 24 Hr.:  930  Oral Intake      In:  930  Length of Stay Totals Intake:  930 Output:      Net:  930   Brief Assessment:  GEN well developed, well nourished, no acute distress, obese   Cardiac Irregular  murmur present  -- LE edema  -- JVD   Respiratory normal resp effort  clear BS   Gastrointestinal Normal   Gastrointestinal details normal Soft   Gastrointestinal details abnormal Minimum Tenderness   Tenderness RLQ   EXTR negative cyanosis/clubbing, negative edema   Lab Results: Routine Chem:  03-Dec-14 07:07   Phosphorus, Serum  5.9 (Result(s) reported on 02 Jun 2013 at 09:13AM.)  BUN  78  Creatinine (comp)  6.96  Sodium, Serum  133  Potassium, Serum 4.3  Chloride, Serum  93  CO2, Serum 29  Calcium (Total), Serum 9.8  Anion Gap 11  Osmolality (calc) 289  eGFR (African American)  8  eGFR (Non-African American)  7 (eGFR values <58m/min/1.73 m2 may be an indication of chronic kidney disease (CKD). Calculated eGFR is useful in patients with stable renal function. The eGFR calculation will not be reliable in acutely ill patients when serum creatinine is changing rapidly. It is not useful in  patients on dialysis. The eGFR calculation may not be applicable to patients at the low and high extremes of body sizes, pregnant women, and vegetarians.)  Routine Coag:   03-Dec-14 07:07   Prothrombin  20.5  INR 1.8 (INR reference interval applies to patients on anticoagulant therapy. A single INR therapeutic range for coumarins is not optimal for all indications; however, the suggested range for most indications is 2.0 - 3.0. Exceptions to the INR Reference Range may include: Prosthetic heart valves, acute myocardial infarction, prevention of myocardial infarction, and combinations of aspirin and anticoagulant. The need for a higher or lower target INR must be assessed individually. Reference: The Pharmacology and Management of the Vitamin K  antagonists: the seventh ACCP Conference on Antithrombotic and Thrombolytic Therapy. CWRUEA.5409Sept:126 (3suppl): 2N9146842 A HCT value >55% may artifactually increase the PT.  In one study,  the increase was an average of 25%. Reference:  "Effect on Routine and Special Coagulation Testing Values of Citrate Anticoagulant Adjustment in Patients with High HCT Values." American Journal of Clinical Pathology 2006;126:400-405.)  Routine Hem:  03-Dec-14 07:07   WBC (CBC) 7.6  RBC (CBC)  3.72  Hemoglobin (CBC)  10.1  Hematocrit (CBC)  31.6  Platelet Count (CBC)  125  MCV 85  MCH 27.0  MCHC  31.8  RDW  20.3  Neutrophil % 83.3  Lymphocyte % 4.9  Monocyte % 10.4  Eosinophil % 1.1  Basophil % 0.3  Neutrophil # 6.3  Lymphocyte #  0.4  Monocyte # 0.8  Eosinophil # 0.1  Basophil #  0.0 (Result(s) reported on 02 Jun 2013 at 07:23AM.)   Assessment/Plan:  Invasive Device Daily Assessment of Necessity:  Does the patient currently have any of the following indwelling devices? foley  none   Indwelling Urinary Catheter continued, requirement due to   Reason to continue Indwelling Urinary Catheter preop or postop order according to surgical protocols   Assessment/Plan:  Assessment IMP Pre-op surgical procedure Appx Abscess AFIB CM Hx CHF ESRD Cellulitis legs chronic CAD HTN .Marland Kitchen   Plan PLAN Heparin for  anticoug pre-op Continue to hold coumadin Agree with antibx pre-op Dialysis today for ESRD Continue Bp control Pain controo with Morphine Continue CM meds Compensated heart failure stable Hopefully drainage of abscess tomorrow   Electronic Signatures: Lujean Amel D (MD)  (Signed 03-Dec-14 12:35)  Authored: Chief Complaint, VITAL SIGNS/ANCILLARY NOTES, Brief Assessment, Lab Results, Assessment/Plan   Last Updated: 03-Dec-14 12:35 by Yolonda Kida (MD)

## 2014-10-21 NOTE — Op Note (Signed)
PATIENT NAME:  Jerry NevinHEATH, Jerry R MR#:  [id number] DATE OF BIRTH:  [birth year]  DATE OF PROCEDURE:  09/24/2012  PREOPERATIVE DIAGNOSES: 1.  Endstage renal disease.  2.  Poorly functioning right arm arteriovenous fistula.  3.  Steal syndrome, right arm.  4.  Hypertension.  5.  Atrial fibrillation.  6.  Diabetes.  7.  Coronary artery disease.  POSTOPERATIVE DIAGNOSES: 1.  Endstage renal disease.  2.  Poorly functioning right arm arteriovenous fistula.  3.  Steal syndrome, right arm.  4.  Hypertension.  5.  Atrial fibrillation.  6.  Diabetes.  7.  Coronary artery disease.  PROCEDURES: 1.  Ultrasound guidance for vascular access to midportion of right brachiocephalic arteriovenous fistula.  2.  Right upper extremity fistulogram and central venogram.  3.  Right upper extremity arteriogram.  4.  Percutaneous transluminal angioplasty of mid to distal right ulnar artery with 3 mm diameter angioplasty balloon.   SURGEON: Annice NeedyJason S. Dew, MD   ANESTHESIA: Local with moderate conscious sedation.   ESTIMATED BLOOD LOSS: Approximately 25 mL.  CONTRAST USED: 60 mL Visipaque.   INDICATION FOR PROCEDURE: This is a 81 year old white male with end-stage renal disease. His right arm is painful, particularly with dialysis. He still has his catheter in place and his fistula has not been reliably usable. Evaluation of the fistula and right upper extremity arterial flow for possible steal was planned today. Risks and benefits were discussed. Informed consent was obtained.   DESCRIPTION OF PROCEDURE: The patient is brought to the vascular and interventional radiology suite. The right upper extremity was sterilely prepped and draped and a sterile surgical field was created. The fistula was accessed, in the midportion, with a micropuncture needle and micropuncture wire and sheath were then placed. It was oriented pointed centrally and the right upper extremity fistulogram and central venogram were performed. This  showed a widely patent fistula with no significant stenosis present and no central venous stenosis. I then compressed the fistula and evaluated the arterial anastomosis which itself was patent. There was some mild narrowing of the brachial artery proximal to the anastomosis that did not appear flow limiting. I continued imaging down the arm and the ulnar artery was the only artery of the hand which was known as his radial artery had been previously harvested for CABG. The ulnar artery was diseased, in the mid to distal portion, and had multiple areas of greater than 50% stenosis in the artery over a several centimeter area. I then exchanged for a 5-French sheath. This was after flipping the micropuncture sheath, in a retrograde fashion, and using a glidewire to traverse into the ulnar artery. Once the 5-French sheath was in, a Kumpe catheter was used and an 0.018 wire was used to cross the lesion of the ulnar artery without difficulty. The 3 mm diameter x 10 cm in length angioplasty balloon was inflated, in the mid to distal ulnar artery, with several areas of waste that was taken which resolved with angioplasty. Completion angiogram following this did show improved flow within the ulnar artery and this was continuous to the hand with a patent palmar arch. At this point, no further endovascular options would exist and if he continues to have steal syndrome either ligation for DRIL bypass would have to be considered. The sheath was removed around a 4-0 Monocryl pursestring suture, pressure was held, sterile dressing was placed. The patient tolerated the procedure well and was taken to the recovery room in stable condition.  ____________________________ Marlow BaarsJason S.  Wyn Quaker, MD jsd:sb D: 09/24/2012 09:18:22 ET     T: 09/24/2012 10:03:38 ET        JOB#: [id number] cc: Annice Needy, MD, <Dictator> Danella Penton, MD Munsoor Lizabeth Leyden, MD Annice Needy MD ELECTRONICALLY SIGNED 09/24/2012 16:59

## 2014-10-21 NOTE — Consult Note (Signed)
Chief Complaint:  Subjective/Chief Complaint Pt denies abdominal pain.  Pt did have 1 small melanotic stool this AM.  Denies nausea or vomiting.  No diarrhea.   VITAL SIGNS/ANCILLARY NOTES: **Vital Signs.:   23-Dec-14 08:00  Temperature Temperature (F) 97.6  Celsius 36.4  Temperature Source oral  Pulse Pulse 90  Respirations Respirations 23  Systolic BP Systolic BP 92  Diastolic BP (mmHg) Diastolic BP (mmHg) 59  Mean BP 70  Pulse Ox % Pulse Ox % 94  Pulse Ox Activity Level  At rest  Oxygen Delivery 4L; Nasal Cannula  Pulse Ox Heart Rate 90    10:00  Vital Signs Type Routine  Temperature Source oral  Pulse Pulse 86  Respirations Respirations 16  Systolic BP Systolic BP 83  Diastolic BP (mmHg) Diastolic BP (mmHg) 49  Mean BP 60  Pulse Ox % Pulse Ox % 99  Pulse Ox Activity Level  At rest  Oxygen Delivery 4L; Nasal Cannula  Pulse Ox Heart Rate 86   Brief Assessment:  GEN well developed, no acute distress, obese, A/Ox3, weak   Cardiac Regular   Respiratory normal resp effort   Gastrointestinal details normal Soft  Bowel sounds normal  No rebound tenderness  No gaurding  +mild distention, dsg intact RLQ   EXTR Trace PT edema bilat   Additional Physical Exam Skin: pale, warm, dry   Lab Results: Routine Chem:  23-Dec-14 04:00   Glucose, Serum  151  BUN  32  Creatinine (comp)  4.45  Sodium, Serum  135  Potassium, Serum 4.0  Chloride, Serum  97  CO2, Serum  33  Calcium (Total), Serum  8.4  Anion Gap  5  Osmolality (calc) 280  eGFR (African American)  14  eGFR (Non-African American)  12 (eGFR values <43m/min/1.73 m2 may be an indication of chronic kidney disease (CKD). Calculated eGFR is useful in patients with stable renal function. The eGFR calculation will not be reliable in acutely ill patients when serum creatinine is changing rapidly. It is not useful in  patients on dialysis. The eGFR calculation may not be applicable to patients at the low and high  extremes of body sizes, pregnant women, and vegetarians.)  Routine Coag:  23-Dec-14 04:00   Prothrombin  17.4  INR 1.4 (INR reference interval applies to patients on anticoagulant therapy. A single INR therapeutic range for coumarins is not optimal for all indications; however, the suggested range for most indications is 2.0 - 3.0. Exceptions to the INR Reference Range may include: Prosthetic heart valves, acute myocardial infarction, prevention of myocardial infarction, and combinations of aspirin and anticoagulant. The need for a higher or lower target INR must be assessed individually. Reference: The Pharmacology and Management of the Vitamin K  antagonists: the seventh ACCP Conference on Antithrombotic and Thrombolytic Therapy. CNATFT.7322Sept:126 (3suppl): [id number] A HCT value >55% may artifactually increase the PT.  In one study,  the increase was an average of 25%. Reference:  "Effect on Routine and Special Coagulation Testing Values of Citrate Anticoagulant Adjustment in Patients with High HCT Values." American Journal of Clinical Pathology 2006;126:400-405.)  Routine Hem:  23-Dec-14 04:00   WBC (CBC) 6.0  RBC (CBC)  2.90  Hemoglobin (CBC)  8.3  Hematocrit (CBC)  26.1  Platelet Count (CBC)  111  MCV 90  MCH 28.8  MCHC  31.9  RDW  21.5  Neutrophil % 76.2  Lymphocyte % 8.6  Monocyte % 11.6  Eosinophil % 2.9  Basophil % 0.7  Neutrophil #  4.6  Lymphocyte #  0.5  Monocyte # 0.7  Eosinophil # 0.2  Basophil # 0.0 (Result(s) reported on 22 Jun 2013 at 04:30AM.)   Assessment/Plan:  Assessment/Plan:  Assessment Melena/Hematemesis: Discussed his care with Dr Allen Norris today.  Pt still high risk for EGD given respiratory & cardiac status.  On PPI.  EGD once stable off Levophed.   Apendiceal abscess s/p percutaneous drainage: followed by surgery   Plan 1) Follow H/H 2) continue PPI 3) EGD once stable Dr Allen Norris to see pt tomorrow. Please call if you have any questions or  concerns.   Electronic Signatures: Andria Meuse (NP)  (Signed 23-Dec-14 11:06)  Authored: Chief Complaint, VITAL SIGNS/ANCILLARY NOTES, Brief Assessment, Lab Results, Assessment/Plan   Last Updated: 23-Dec-14 11:06 by Andria Meuse (NP)

## 2014-10-21 NOTE — Consult Note (Signed)
Patient with ESRD.  On PD.  Admitted with peritonitis.  Has Pseudomomas positive from peritoneal fluid.with Dr. Thedore MinsSingh that the catheter will need to be removed to clear the infection.  His INR today is 3.0, and would like this be down to 2 or below before removal and temp cath placement. Realistically this will probably be Tuesday or Wednesday.  Fortunately he is stable on Abx.  Please hold anti-coagulation until PD catheter removed.  On checking his drug list, this appears to be currently suspended. Discussed with patient that PD may be a possibility in the future but will need to be catheter free likely for several months prior to attempting replacement.     Electronic Signatures: Annice Needyew, Cheryal Salas S (MD) (Signed on 19-Jan-14 15:41)  Authored   Last Updated: 19-Jan-14 15:42 by Annice Needyew, Leanora Murin S (MD)

## 2014-10-21 NOTE — Op Note (Signed)
PATIENT NAME:  Jerry Allen, Jerry Allen MR#:  [id number] DATE OF BIRTH:  1933-05-03  DATE OF PROCEDURE:  07/23/2012  PREOPERATIVE DIAGNOSES: 1. End-stage renal disease.  2. Recent peritonitis with removal of peritoneal dialysis catheter.  3. Hypertension.  4. Atrial fibrillation.  5. Coronary artery disease.  6. Diabetes.   POSTOPERATIVE DIAGNOSES:   1. End-stage renal disease.  2. Recent peritonitis with removal of peritoneal dialysis catheter.  3. Hypertension.  4. Atrial fibrillation.  5. Coronary artery disease.  6. Diabetes.   PROCEDURES PERFORMED: 1. Ultrasound guidance for vascular access, right jugular vein.  2. Fluoroscopic guidance for placement of catheter.  3. Placement of 23 cm tip-to-cuff tunneled hemodialysis catheter.   SURGEON: Annice NeedyJason S. Allaina Brotzman, M.D.   ANESTHESIA: Local with moderate conscious sedation.   ESTIMATED BLOOD LOSS: Approximately 25 mL.   INDICATION FOR PROCEDURE: This is a 81 year old gentleman with end-stage renal disease. He was using a peritoneal dialysis catheter which had to be removed earlier this week for infection. He will now need durable hemodialysis access. The options, risks and benefits were discussed and informed consent was obtained.   DESCRIPTION OF PROCEDURE: The patient is brought to the vascular and interventional radiology suite. The right neck and chest were sterilely prepped and draped and a sterile surgical field was created. The right jugular vein was visualized with ultrasound and found to widely patent. It was then accessed under direct ultrasound guidance without difficulty with a Seldinger needle. After skin nick and dilatation, the peel-away sheath was placed over the wire. I then anesthetized an area 2 to 3 fingerbreadths below the right clavicle and tunneled from the subclavicular incision to the access site. Using fluoroscopic guidance, I selected a 23 cm tip-to-cuff tunneled hemodialysis catheter and tunneled this catheter. I placed this  through the peel-away sheath and parked the catheter tip in the right atrium. The cuff was about two-thirds the way from the access incision to the exit site. The appropriate distal connectors were placed. It withdrew blood well and flushed easily with heparinized saline and a concentrated heparin solution was then placed. The access incision was closed with a single 4-0 Monocryl and a single 4-0 Monocryl pursestring was placed around the exit site. The catheter was secured to the chest wall with 2 Prolene sutures. Sterile dressings were placed. The patient tolerated the procedure well and was taken to the recovery room in stable condition.  ____________________________ Annice NeedyJason S. Jem Castro, MD jsd:sb D: 07/23/2012 09:00:34 ET T: 07/23/2012 09:09:06 ET JOB#: [id number]  cc: Annice NeedyJason S. [place], MD, <Dictator> Annice NeedyJASON S Aliyyah Riese MD ELECTRONICALLY SIGNED 07/27/2012 9:13

## 2014-10-21 NOTE — Consult Note (Signed)
Chief Complaint:  Subjective/Chief Complaint Patient without any sign of active GI bleeding. The Hb has been stable. No nausea, vomiting, black stools or bloody stools. He denies any abd pain.   VITAL SIGNS/ANCILLARY NOTES: **Vital Signs.:   30-Dec-14 10:00  Vital Signs Type Routine  Temperature Source oral  Pulse Pulse 78  Respirations Respirations 19  Systolic BP Systolic BP 76  Diastolic BP (mmHg) Diastolic BP (mmHg) 47  Mean BP 56  Pulse Ox % Pulse Ox % 100  Oxygen Delivery 3L; Nasal Cannula  Pulse Ox Heart Rate 74   Brief Assessment:  GEN well developed, well nourished, no acute distress   Cardiac Regular   Respiratory normal resp effort   Gastrointestinal Normal   Gastrointestinal details normal Soft  Nontender  Nondistended   Additional Physical Exam Alert and orientated times 3   Lab Results: Hepatic:  30-Dec-14 07:12   Albumin, Serum  2.5 (Result(s) reported on 29 Jun 2013 at 07:43AM.)  Routine Chem:  30-Dec-14 07:12   Glucose, Serum  132  BUN  34  Creatinine (comp)  3.89  Sodium, Serum 137  Chloride, Serum 98  CO2, Serum  34  Calcium (Total), Serum 9.0  Anion Gap  5  Osmolality (calc) 283  eGFR (African American)  16  eGFR (Non-African American)  14 (eGFR values <52m/min/1.73 m2 may be an indication of chronic kidney disease (CKD). Calculated eGFR is useful in patients with stable renal function. The eGFR calculation will not be reliable in acutely ill patients when serum creatinine is changing rapidly. It is not useful in  patients on dialysis. The eGFR calculation may not be applicable to patients at the low and high extremes of body sizes, pregnant women, and vegetarians.)  Result Comment LABS - This specimen was collected through an   - indwelling catheter or arterial line.  - A minimum of 524m of blood was wasted prior    - to collecting the sample.  Interpret  - results with caution.  Result(s) reported on 29 Jun 2013 at 07:34AM.  Routine  Hem:  30-Dec-14 07:12   WBC (CBC) 6.0  RBC (CBC)  2.88  Hemoglobin (CBC)  8.5  Hematocrit (CBC)  26.4  Platelet Count (CBC)  129  MCV 92  MCH 29.5  MCHC 32.3  RDW  23.2  Neutrophil % 72.9  Lymphocyte % 10.2  Monocyte % 13.3  Eosinophil % 1.8  Basophil % 1.8  Neutrophil # 4.4  Lymphocyte #  0.6  Monocyte # 0.8  Eosinophil # 0.1  Basophil # 0.1 (Result(s) reported on 29 Jun 2013 at 07:34AM.)   Assessment/Plan:  Assessment/Plan:  Assessment Upper GI bleed.   Plan Doing much better now. No sign of any active bleeding. Hb has been stable. Nothing new to add. Will consider intervention if rebleeding occurs.   Electronic Signatures: WoLucilla LameMD)  (Signed 30-Dec-14 11:55)  Authored: Chief Complaint, VITAL SIGNS/ANCILLARY NOTES, Brief Assessment, Lab Results, Assessment/Plan   Last Updated: 30-Dec-14 11:55 by WoLucilla LameMD)

## 2014-10-21 NOTE — Consult Note (Signed)
PATIENT NAME:  Jerry Allen, Jerry Allen MR#:  [id number] DATE OF BIRTH:  February 23, 1933  DATE OF CONSULTATION:  06/01/2013  REFERRING PHYSICIAN:  Carmie Endalph L. Ely III, MD CONSULTING PHYSICIAN:  Danella PentonMark F. Miller, MD  This is a 81 year old male who presents for consultation from Dr. Marshia Lyandy Ely for preoperative evaluation and treatment. The patient has had a progressive 4-day history of right lower quadrant pain, found to have a large appendiceal abscess. He was admitted last night and started on IV Zosyn. His pro time is 2.1. He notes no chest pain, orthopnea, PND. He received dialysis yesterday pre-CT.   PAST MEDICAL HISTORY: MEDICAL ILLNESSES:  1.  Diabetes mellitus, insulin requiring. 2.  Coronary artery disease, EF 40%, MI in 2004.  3.  Hypertension.  4.  ESRD, hemodialysis. 5.  Chronic A. fib.  6.  Osteoarthritis.  7.  AAA.   SURGERIES:   1.  CABG x4 with mitral valve repair in 2004. 2.  Left total knee replacement.   ALLERGIES: IRON.   MEDICATIONS: Aspirin 81 mg daily, vitamin D3 at 1000 units daily, torsemide 20 mg daily, Crestor 20 mg daily, oxygen 2 liters daily, Carafate 1 gram q.a.c., Lantus SoloSTAR insulin 30 units at bedtime, warfarin 3 mg at bedtime, Protonix 40 mg daily.   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 Fifty pack-year history of smoking, stopped in 2004. No alcohol.   FAMILY HISTORY: Father died at age 79 of CHF. Sister died of breast cancer.   REVIEW OF SYSTEMS: As above, otherwise negative.   PHYSICAL EXAMINATION: VITAL SIGNS: Blood pressure 134/70, pulse 80, weight 184.  HEENT: Benign.  NECK: No thyromegaly or bruits.  LUNGS: Clear.  HEART: Irregular rhythm, III/VI systolic murmur throughout the precordium.  ABDOMEN: Bowel sounds present. Mass effect right lower quadrant to right mid quadrant, basically right flank.  EXTREMITIES: No edema, 2+ dorsal pedal pulses.  NEUROLOGIC: Grossly nonfocal.   LABORATORY AND RADIOLOGICAL DATA: Pro time 2.1. White count  11,000, hemoglobin 10. CT of the abdomen showed a large sub-liver periappendiceal abscess.   ASSESSMENT AND PLAN: 1.  Diabetes mellitus, insulin requiring: Will treat with sliding scale insulin at this point.  2.  Chronic atrial fibrillation, rate controlled, post mitral valve repair: Cardiology has seen. No recent ischemic symptoms.  3.  Appendiceal abscess: Plan for drainage tomorrow. Surgical treatment as to whether the appendix will be removed or not.  ____________________________ Danella PentonMark F. Miller, MD mfm:jcm D: 06/01/2013 13:21:33 ET T: 06/01/2013 14:04:54 ET JOB#: [id number]  cc: Danella PentonMark F. Miller, MD, <Dictator> Carmie Endalph L. Ely III, MD Danella PentonMARK F MILLER MD ELECTRONICALLY SIGNED 06/03/2013 7:26

## 2014-10-21 NOTE — Op Note (Signed)
PATIENT NAME:  Jerry NevinHEATH, Zackery R MR#:  [id number] DATE OF BIRTH:  07/28/1932  DATE OF PROCEDURE:  10/08/2012  PREOPERATIVE DIAGNOSES:  1.  End-stage renal disease.  2.  Previous peritonitis with removal of peritoneal dialysis catheter, now treated with desire to resume peritoneal dialysis.  3.  Oxygen-dependent respiratory distress. 4.  Atrial fibrillation.  5.  Coronary artery disease.   POSTOPERATIVE DIAGNOSES:  1.  End-stage renal disease.  2.  Previous peritonitis with removal of peritoneal dialysis catheter, now treated with desire to resume peritoneal dialysis.  3.  Oxygen-dependent respiratory distress. 4.  Atrial fibrillation.  5.  Coronary artery disease.   PROCEDURE:   1.  Laparoscopic lysis of adhesions.  2.  Laparoscopic placement of peritoneal dialysis catheter.   SURGEON: Annice NeedyJason S. Steffanie Mingle, M.D.   ANESTHESIA: General.   ESTIMATED BLOOD LOSS: 25 mL.   INDICATION FOR PROCEDURE: This is a gentleman with end-stage renal disease. He had to stop peritoneal dialysis several months ago due to infection. This has now been treated and he desires to resume peritoneal dialysis. He is brought in for laparoscopic evaluation and if possible peritoneal dialysis catheter placement. Risks and benefits were discussed. Informed consent was obtained.   DESCRIPTION OF PROCEDURE: The patient was brought to the operative suite and after an adequate level of general anesthesia was obtained, the abdomen was sterilely prepped and draped and a sterile surgical field was created. An Optiview trocar was placed under direct visualization in the right upper quadrant. Upon entering, there were significant intra-abdominal adhesions. These were flimsy but extensive. I then placed a left upper quadrant 5 mm trocar and took these down tediously over several minutes to gain access to the pelvis. The pelvis had significant adhesions as well and these were dissected free, also tediously. I then placed the small incision  just to the left of the umbilicus and put the peritoneal dialysis catheter in under direct visualization of the pelvis under the trocar. A counterincision was made in the left abdomen and the distal end of the peritoneal dialysis catheter was pulled out. The deep cuff was at the abdominal fascia. I instilled 500 mL of saline and over 300 mL returned immediately. I then desufflated the abdomen and closed all the wounds with 3-0 Vicryl and 4-0 Monocryl. Dermabond was placed as a dressing. Due to the extensive nature of the adhesions, we have discussed ligation of his fistula, but I wanted to be sure that this peritoneal dialysis catheter would work over the next several weeks before we ligated the fistula. At this point, we did not ligate his fistula and this will be planned at a later date if his peritoneal dialysis is successful.   ____________________________ Annice NeedyJason S. Shaya Altamura, MD jsd:jm D: 10/08/2012 16:48:54 ET T: 10/08/2012 21:21:36 ET JOB#: [id number]  cc: Annice NeedyJason S. Granvel Proudfoot, MD, <Dictator> Munsoor Lizabeth LeydenN. Lateef, MD Annice NeedyJASON S Elie Leppo MD ELECTRONICALLY SIGNED 10/10/2012 10:06

## 2014-10-21 NOTE — Op Note (Signed)
PATIENT NAME:  Jerry NevinHEATH, [name] MR#:  [id number] DATE OF BIRTH:  [birth year]  DATE OF PROCEDURE:  07/21/2012  PREOPERATIVE DIAGNOSIS:  Pseudomonas peritonitis.   POSTOPERATIVE DIAGNOSIS:  Pseudomonas peritonitis.  PROCEDURE PERFORMED:  Removal of peritoneal dialysis catheter.   SURGEON: Levora DredgeGregory Schnier, MD  ANESTHESIA: MAC.   ESTIMATED BLOOD LOSS: Minimal.   SPECIMEN:   Catheter photographed for the permanent record.   INDICATIONS: The patient is a 81 year old gentleman who presents to the hospital with worsening abdominal pain and signs and symptoms consistent with peritonitis. Effluent from his dialysate his grown Pseudomonas. He is therefore undergoing removal of his peritoneal dialysis catheter and will be bridged on hemodialysis. The risks and benefits were reviewed. All questions are answered. The patient agrees to proceed.   DESCRIPTION OF PROCEDURE: The patient is taken to the Operating Room and placed in the supine position. After adequate sedation is achieved, his abdomen and catheter are prepped and draped in a sterile fashion. 0.25% Marcaine with 1% lidocaine with epinephrine is then infiltrated in the soft tissues in the left lower quadrant around the previous incisional scar. Scalpel is then used to reopen the skin through the scar tissue and the dissection is carried down to identify the catheter. The catheter is then followed down to the cuff and the cuff is dissected free. Hemostasis achieved with Bovie cautery and 3-0 Vicryl interrupted figure-of-eight sutures. Once this cuff has been freed, the catheter is easily extracted from the abdomen indicating this is the distal cuff. The proximal cuff is then identified, dissected circumferentially. The catheter is transected and then removed in 2 separate but otherwise intact pieces. Pursestring suture of 2-0 Vicryl was placed around the catheter in the rectus sheath to control the leaking peritoneal fluid.   The wound is then irrigated  with saline and closed in multiple layers using 2-0 and then 3-0 Vicryl. 4-0 Monocryl is utilized to close skin with subcuticular and Dermabond is applied. Antibiotic ointment and bandages is applied to the old exit site. The patient tolerated the procedure well and there were no immediate complications.    ____________________________ Renford DillsGregory G. Schnier, MD ggs:ct D: 07/21/2012 20:52:36 ET T: 07/22/2012 07:34:09 ET JOB#: [id number]  cc: Renford DillsGregory G. Schnier, MD, <Dictator> Dr. Nile RiggsSingh GREGORY G SCHNIER MD ELECTRONICALLY SIGNED 07/24/2012 10:05

## 2014-10-21 NOTE — Discharge Summary (Signed)
PATIENT NAME:  Jerry NevinHEATH, Sahand R MR#:  [id number] DATE OF BIRTH:  10-Aug-1932  DATE OF ADMISSION:  05/31/2013 DATE OF DISCHARGE:  06/08/2013  BRIEF HISTORY OF PRESENT ILLNESS:  Jerry Allen is a 81 year old gentleman with multiple medical problems including significant coronary artery disease, mild congestive heart failure, cardiomyopathy, valvular heart disease, diabetes, hypertension, chronic renal insufficiency requiring dialysis admitted to the hospital with abdominal pain.  Work-up in the Emergency Room identified what appeared to be a large right lower quadrant abscess and was felt to be most likely related to ruptured appendicitis.  The patient was admitted to the hospital that evening, seen by the primary care physicians and his cardiologist the following day.  He had been chronic anticoagulated secondary to his cardiac dysrhythmia and so his Pro Time was allowed to drift back down toward normal.  On December the 4th, he underwent percutaneous drainage of his abscess in the interventional radiology suite.  The procedure was uncomplicated initially.  That evening he developed some profound respiratory insufficiency felt to be bacteremia which improved over the next several hours.  He did not require ventilator support and his symptoms improved.  He was placed back on his dialysis regimen, treated with intravenous antibiotics.  He appeared to improve with return of his bowel function and the defervesce of his other abdominal symptoms.  He was discharged home on the 9th to be followed in the office in 7 to 10 days' time.  Bathing, activity and driving instructions were given to the patient.   DISCHARGE MEDICATIONS:  Include Crestor 20 mg once a day, aspirin 81 mg once a day, vitamin D 1000 mg once a day, Lantus Sulster 100 units per mL 30 units subQ in the morning, torsemide 20 mg by mouth daily, calcium 667 mg by mouth daily, Protonix 40 mg by mouth daily, Carafate 1 gram three times daily, warfarin 3 mg on  Monday, Wednesday, Friday and 1.5 mg on Tuesday, Thursday, Saturday and Sunday.  Metronidazole 500 mg q. 8 hours, ciprofloxacin 500 mg q. 12 hours, acetaminophen hydrocodone 325/5 mg every six hours as needed.   FINAL DISCHARGE DIAGNOSES:   1.  Pelvic abscess, most likely secondary to ruptured appendicitis.   2.  Peripheral vascular disease.  3.  Atrial fibrillation.  4.  Coronary artery disease.  5.  Cardiac valvular disease.  6.  Cardiomyopathy.  7.  Hypertension.  8.  Diabetes type 2, insulin-dependent.  9.  Anemia.  10.  Chronic renal insufficiency.   SURGERY:  Percutaneous drainage of abdominal abscess.     ____________________________ Quentin Orealph L. Ely III, MD rle:ea D: 06/12/2013 00:39:10 ET T: 06/12/2013 02:15:50 ET JOB#: [id number]  cc: Quentin Orealph L. Ely III, MD, <Dictator> Dwayne D. Juliann Paresallwood, MD Dr. Sherrine MaplesGlenn Jon BillingsYamagata Mark F. Miller, MD Quentin OreALPH L ELY MD ELECTRONICALLY SIGNED 06/12/2013 19:56

## 2014-10-21 NOTE — Consult Note (Signed)
Chief Complaint:  Subjective/Chief Complaint Pt denies abdominal pain.  Denies nausea, vomiting, rectal bleeding or melena.  No diarrhea.   VITAL SIGNS/ANCILLARY NOTES: **Vital Signs.:   22-Dec-14 04:00  Vital Signs Type Routine  Temperature Temperature (F) 97.9  Celsius 36.6  Temperature Source axillary  Pulse Pulse 82  Respirations Respirations 14  Systolic BP Systolic BP 881  Diastolic BP (mmHg) Diastolic BP (mmHg) 63  Mean BP 76  Pulse Ox % Pulse Ox % 100  Oxygen Delivery 4L; Nasal Cannula  Pulse Ox Heart Rate 82    06:00  Vital Signs Type Routine  Pulse Pulse 80  Respirations Respirations 12  Systolic BP Systolic BP 84  Diastolic BP (mmHg) Diastolic BP (mmHg) 49  Mean BP 60  Pulse Ox % Pulse Ox % 99  Oxygen Delivery 4L; Nasal Cannula  Pulse Ox Heart Rate 82   Brief Assessment:  GEN well developed, no acute distress, obese, A/Ox3   Cardiac Regular   Respiratory normal resp effort   Gastrointestinal details normal Soft  Bowel sounds normal  No rebound tenderness  No gaurding  +mild distention, dsg intact RLQ   EXTR Trace PT edema bilat   Additional Physical Exam Skin: pale, warm, dry   Lab Results: LabObservation:  22-Dec-14 08:50   OBSERVATION Reason for Test  Hepatic:  22-Dec-14 08:04   Albumin, Serum  2.2  Cardiology:  22-Dec-14 08:50   Echo Doppler REASON FOR EXAM:     COMMENTS:     PROCEDURE: [place] - ECHO DOPPLER COMPLETE(TRANSTHOR)  - Jun 21 2013  8:50AM   RESULT: Echocardiogram Report  Patient Name:   Jerry Allen Date of Exam: 06/21/2013 Medical Rec #:  [id number]       Custom1: Date of Birth:  10/27/1932    Height:       72.0 in Patient Age:    81 years     Weight:       201.0 lb Patient Gender: M            BSA:          2.13 m??  Indications: CHF Sonographer:    Sherrie Sport RDCS Referring Phys: Emily Filbert, F  Sonographer Comments: Technically challenging study due to less than  ideal echo windows.  Summary:  1. Left ventricular  ejection fraction, by visual estimation, is 30 to  35%.  2. Moderately decreased global left ventricular systolic function.  3. Moderate left ventricular hypertrophy.  4. Moderately dilated left atrium.  5. Mild mitral valve regurgitation.  6. Severe aortic valve stenosis.  7. Moderately increased left ventricular posterior wall thickness.  8. Mild tricuspid regurgitation. 2D ANDM-MODE MEASUREMENTS (normal ranges within parentheses): Left Ventricle:          Normal IVSd (2D):      1.68 cm (0.7-1.1) LVPWd (2D):     1.34 cm (0.7-1.1) Aorta/LA:                  Normal LVIDd (2D):     4.94 cm (3.4-5.7) Aortic Root (2D): 3.80cm (2.4-3.7) LVIDs (2D):     4.21 cm           Left Atrium (2D): 5.10 cm (1.9-4.0) LV FS (2D):     14.8 %   (>25%) LV EF (2D):     31.3 %   (>50%)  Right Ventricle:                                   RVd (2D):   5.05 cm LV DIASTOLIC FUNCTION: MV Peak E: 1.56 m/s E/e' Ratio: 20.80                     Decel Time: 285 msec SPECTRAL DOPPLER ANALYSIS (where applicable): Mitral Valve: MV Max Vel:   2.12 m/s MV P1/2 Time: 82.65 msec MV Mean Grad: 6.0 mmHgMV Area, PHT: 2.66 cm?? Aortic Valve: AoV Max Vel: 1.85 m/s AoV Peak PG: 13.7 mmHg AoV Mean PG:  7.8 mmHg LVOT Vmax: 0.39 m/s LVOT VTI: 0.067 m LVOT Diameter: 2.20 cm AoV Area, Vmax: 0.80 cm?? AoV Area, VTI: 0.74 cm?? AoV Area, Vmn: 0.81 cm?? Tricuspid Valve and PA/RV Systolic Pressure: TR Max Velocity: 2.88 m/s RA  Pressure: 5 mmHg RVSP/PASP: 38.2 mmHg Pulmonic Valve: PV Max Velocity: 1.35 m/s PV Max PG: 7.3 mmHg PV Mean PG:  PHYSICIAN INTERPRETATION: Left Ventricle: The left ventricular internal cavity size was normal. LV  posterior wall thickness was moderately increased. Moderate left  ventricular hypertrophy. Global LV systolic function was moderately   decreased. Left ventricular ejection fraction, by visual estimation, is  30 to 35%. Right Ventricle: The right ventricular size  is normal. Left Atrium: The left atrium is moderately dilated. Right Atrium: The right atrium is normal in size. Pericardium: There is no evidence of pericardial effusion. Mitral Valve: Mild mitral valve regurgitation is seen. Tricuspid Valve: Mild tricuspid regurgitation is visualized. The  tricuspid regurgitant velocity is 2.88 m/s, and with an assumed right  atrial pressure of 5 mmHg, the estimated right ventricular systolic  pressure is normal at 38.2 mmHg. Aortic Valve: Severe aortic stenosis is present. Aorta: The aortic root is normal in size and structure.  Clifton MD Electronically signed by [id number] Isaias Cowman MD Signature Date/Time: 06/21/2013/9:33:15 AM  *** Final ***  IMPRESSION: .    Verified BySheppard Coil . PARASCHOS, M.D., MD  Routine Chem:  22-Dec-14 05:10   Result Comment LABS - This specimen was collected through an   - indwelling catheter or arterial line.  - A minimum of 49ms of blood was wasted prior    - to collecting the sample.  Interpret  - results with caution.  Result(s) reported on 21 Jun 2013 at 05:34AM.    08:04   Glucose, Serum  156  BUN  48  Creatinine (comp)  5.81  Sodium, Serum  133  Potassium, Serum 4.0  Chloride, Serum  96  CO2, Serum 31  Calcium (Total), Serum 8.5  Phosphorus, Serum  5.4  Anion Gap  6  Osmolality (calc) 282  eGFR (African American)  10  eGFR (Non-African American)  9 (eGFR values <632mmin/1.73 m2 may be an indication of chronic kidney disease (CKD). Calculated eGFR is useful in patients with stable renal function. The eGFR calculation will not be reliable in acutely ill patients when serum creatinine is changing rapidly. It is not useful in  patients on dialysis. The eGFR calculation may not be applicable to patients at the low and high extremes of body sizes, pregnant women, and vegetarians.)  Routine Coag:  22-Dec-14 05:10   Prothrombin  16.9  INR 1.4 (INR reference interval applies  to patients on anticoagulant therapy. A single INR therapeutic range for coumarins is not optimal for all indications; however, the  suggested range for most indications is 2.0 - 3.0. Exceptions to the INR Reference Range may include: Prosthetic heart valves, acute myocardial infarction, prevention of myocardial infarction, and combinations of aspirin and anticoagulant. The need for a higher or lower target INR must be assessed individually. Reference: The Pharmacology and Management of the Vitamin K  antagonists: the seventh ACCP Conference on Antithrombotic and Thrombolytic Therapy. WIOXB.3532 Sept:126 (3suppl): [id number]. A HCT value >55% may artifactually increase the PT.  In one study,  the increase was an average of 25%. Reference:  "Effect on Routine and Special Coagulation Testing Values of Citrate Anticoagulant Adjustment in Patients with High HCT Values." American Journal of Clinical Pathology 2006;126:400-405.)  Routine Hem:  22-Dec-14 05:10   WBC (CBC) 7.0  RBC (CBC)  3.13  Hemoglobin (CBC)  8.7  Hematocrit (CBC)  28.0  Platelet Count (CBC) 155  MCV 89  MCH 27.8  MCHC  31.1  RDW  21.7  Neutrophil % 74.9  Lymphocyte % 8.7  Monocyte % 13.0  Eosinophil % 2.0  Basophil % 1.4  Neutrophil # 5.2  Lymphocyte #  0.6  Monocyte # 0.9  Eosinophil # 0.1  Basophil # 0.1 (Result(s) reported on 21 Jun 2013 at 10:14AM.)   Assessment/Plan:  Assessment/Plan:  Assessment Melena/Hematemesis: Likely PUD.  On PPI.  EGD off Levophed.  INR 1.4.   Cirrhosis:  Thrombocytopenia improving Apendiceal abscess s/p percutaneous drainage: followed by surgery Sepsis: On levophed, antibiotics   Plan 1) Follow H/H 2) continue PPI 3) EGD once stable Please call if you have any questions or concerns   Electronic Signatures: Andria Meuse (NP)  (Signed 22-Dec-14 11:37)  Authored: Chief Complaint, VITAL SIGNS/ANCILLARY NOTES, Brief Assessment, Lab Results, Assessment/Plan   Last Updated:  22-Dec-14 11:37 by Andria Meuse (NP)

## 2014-10-21 NOTE — Op Note (Signed)
PATIENT NAME:  Gilkerson, Dantre R MR#:  [id number] DATE OF BIRTH:  12/15/1932  DATE OF PROCEDURE:  07/21/2012  PREOPERATIVE DIAGNOSIS:  Pseudomonas peritonitis.   POSTOPERATIVE DIAGNOSIS:  Pseudomonas peritonitis.  PROCEDURE PERFORMED:  Removal of peritoneal dialysis catheter.   SURGEON: Taleya Whitcher, MD  ANESTHESIA: MAC.   ESTIMATED BLOOD LOSS: Minimal.   SPECIMEN:   Catheter photographed for the permanent record.   INDICATIONS: The patient is a 81-year-old gentleman who presents to the hospital with worsening abdominal pain and signs and symptoms consistent with peritonitis. Effluent from his dialysate his grown Pseudomonas. He is therefore undergoing removal of his peritoneal dialysis catheter and will be bridged on hemodialysis. The risks and benefits were reviewed. All questions are answered. The patient agrees to proceed.   DESCRIPTION OF PROCEDURE: The patient is taken to the Operating Room and placed in the supine position. After adequate sedation is achieved, his abdomen and catheter are prepped and draped in a sterile fashion. 0.25% Marcaine with 1% lidocaine with epinephrine is then infiltrated in the soft tissues in the left lower quadrant around the previous incisional scar. Scalpel is then used to reopen the skin through the scar tissue and the dissection is carried down to identify the catheter. The catheter is then followed down to the cuff and the cuff is dissected free. Hemostasis achieved with Bovie cautery and 3-0 Vicryl interrupted figure-of-eight sutures. Once this cuff has been freed, the catheter is easily extracted from the abdomen indicating this is the distal cuff. The proximal cuff is then identified, dissected circumferentially. The catheter is transected and then removed in 2 separate but otherwise intact pieces. Pursestring suture of 2-0 Vicryl was placed around the catheter in the rectus sheath to control the leaking peritoneal fluid.   The wound is then irrigated  with saline and closed in multiple layers using 2-0 and then 3-0 Vicryl. 4-0 Monocryl is utilized to close skin with subcuticular and Dermabond is applied. Antibiotic ointment and bandages is applied to the old exit site. The patient tolerated the procedure well and there were no immediate complications.    ____________________________ Kamori Kitchens G. Terryann Verbeek, MD ggs:ct D: 07/21/2012 20:52:36 ET T: 07/22/2012 07:34:09 ET JOB#: [id number]  cc: Falon Huesca G. Maxene Byington, MD, <Dictator> Dr. Singh Luvenia Cranford G Janna Oak MD ELECTRONICALLY SIGNED 07/24/2012 10:05 

## 2014-10-21 NOTE — Op Note (Signed)
PATIENT NAME:  Jerry Allen, Jerry Allen MR#:  [id number] DATE OF BIRTH:  [birth year]  DATE OF OPERATION:  10/15/2012  PREOPERATIVE DIAGNOSES: 1.  Peripheral arterial disease with ulcer, left lower extremity.  2.  Hypertension.  3.  End-stage renal disease.   POSTOPERATIVE DIAGNOSES: 1.  Peripheral arterial disease with ulcer, left lower extremity.  2.  Hypertension.  3.  End-stage renal disease.   PROCEDURES: 1.  Ultrasound guidance for vascular access, right femoral artery.  2.  Catheter placement into left popliteal artery from right femoral approach.  3.  Aortogram and selective left lower extremity angiogram.  4. Percutaneous transluminal angioplasty of right common iliac artery with 7 mm diameter angioplasty balloon.  5. Percutaneous transluminal angioplasty of left common iliac artery with a 7 mm diameter angioplasty balloon.  6.  Percutaneous transluminal angioplasty of popliteal artery and distal superficial femoral artery with 4 and 5 mm diameter angioplasty balloons.  7. Viabahn stent placement to left popliteal artery and distal superficial femoral artery for greater than 50% residual stenosis after angioplasty using a 6 mm diameter Viabahn stent.  8.  StarClose closure device, right femoral artery.   SURGEON: Annice Needy, M.D.   ANESTHESIA: Local, with moderate conscious sedation.   BLOOD LOSS: Approximately 25 mL.   CONTRAST USED: 65 mL Visipaque, and approximately 15 minutes fluoroscopy time were used.   INDICATIONS FOR PROCEDURE: This is a gentleman well-known to me for multiple vascular issues. We care for his dialysis access. He also has ulceration of the lower extremities, with significant peripheral vascular disease. He was brought in today for an angiogram, with possible revascularization. Risks and benefits were discussed. Informed consent was obtained.   DESCRIPTION OF PROCEDURE: The patient is brought to the vascular and interventional radiology suite. Groins were shaved  and prepped and a sterile surgical field was created. The right femoral had been localized by fluoroscopy and the right femoral artery was visualized with ultrasound and found to be patent. It was then accessed under direct ultrasound guidance without difficulty with a Seldinger needle, and a permanent image was recorded. A 5-French sheath was placed. A pigtail catheter was placed in the aorta and an AP aortogram was performed. This showed a saccular abdominal aortic aneurysm in the mid-infrarenal aorta. The renal arteries were small, but appeared patent. There was an 80% stenosis in the right common iliac artery just beyond its origin. There was an approximately 70% stenosis in the left common iliac artery about halfway between the origin and the hypogastric origin. There was also marked tortuosity and a steep aortic bifurcation making crossing quite tedious. This was done, and we were able to park an Advantage wire into the superficial femoral artery. We first treated both iliac stenoses to help our inflow and our crossing, and 7 mm diameter angioplasty balloons were inflated in both iliac arteries, first in the right iliac artery and, next, in the left iliac artery.   Following this I was able to get a sheath track up over and park it in the distal left external artery. The left lower extremity angiogram was then performed. This demonstrated a moderate stenosis in the mid-portion of the SFA and an occlusion in the distal SFA at Hunter's canal. This reconstituted the popliteal artery just above the knee. He then had two-vessel runoff distally. The patient was systemically heparinized. I was able to cross the lesion with a CXI catheter and the Terumo Advantage wire and confirm intraluminal flow in the popliteal artery just below  the knee again demonstrating the 2-vessel runoff. I then exchanged for an 0.018 wire to decrease crossing profile due to the heavy, calcific nature of the lesion and the high amount of  stored energy within our catheter, sheath and wire. A 4 mm diameter angioplasty balloon was inflated from just below the knee to up in the superficial femoral artery and a 5 mm diameter angioplasty balloon was inflated to the above-knee popliteal artery and the mid-to-distal SFA. Completion angiogram following this showed a significant residual stenosis, mostly in the above-knee popliteal artery. A 6 mm diameter x 15 cm Viabahn stent was used to encompass the lesion. This was opened, ironed out with a 5 mm balloon with an excellent angiographic completion result, and maintained runoff distally. At this point I elected to terminate the procedure. The sheath was pulled back to the ipsilateral external iliac artery and an oblique arteriogram was performed. StarClose closure device was deployed in the usual fashion, with excellent hemostatic result. The patient tolerated the procedure well and was taken to the recovery room in stable condition.     ____________________________ Annice NeedyJason S. Dew, MD jsd:dm D: 10/15/2012 09:43:51 ET T: 10/15/2012 10:02:29 ET JOB#: [id number]  cc: Annice NeedyJason S. Dew, MD, <Dictator> Annice NeedyJASON S DEW MD ELECTRONICALLY SIGNED 10/15/2012 15:31

## 2014-10-21 NOTE — H&P (Signed)
PATIENT NAME:  Jerry Allen, Jerry Allen MR#:  [id number] DATE OF BIRTH:  1933-03-13  DATE OF PLANNED ADMISSION:  06/16/2013  CHIEF COMPLAINT:  Nausea, vomiting and diarrhea.  HISTORY OF PRESENT ILLNESS: Jerry Allen is a 81 year old male recently admitted to the hospital at [facility] with a multitude of issues centered around right lower quadrant appendiceal abscess. This was drained percutaneously. He had a stormy course on multiple antibiotics.  He has end-stage renal disease on dialysis. He is quite weak with multiple other problems including atrial fibrillation on Coumadin and had been receiving peritoneal dialysis in the past as well. He went home. He was weak. Home health is following him in bed. He called the surgeon's office this morning given nausea and vomiting that developed today combined with diarrhea multiple times a day up to 4-1/2 day. The surgeon's office asked that he present to the Emergency Room. He did that where some fluid was still seen in the region of the drain on a CT scan. Surgery service was called; but given his hypotension that was also found, they asked internal medicine to see the patient and him admit him to the hospital. Hence, I saw the patient who was a regular patient of Dr. Bethann Punches whom I am covering for tonight. The story is as noted above, profuse watery diarrhea on oral antibiotics. C. diff has been negative here. Nausea, vomiting and some abdominal pain also. The pain is better with morphine. Nausea is still present.  REVIEW OF SYSTEMS:  Negative other than what is noted above.   PAST MEDICAL HISTORY: 1.  Recent appendiceal abscess percutaneous drainage as noted above.  2.  Severe coronary artery disease.  3.  End-stage renal disease.  4.  Atrial fibrillation, on Coumadin.  5.  Peripheral vascular disease.  6.  Venous insufficiency.  PAST SURGICAL HISTORY: 1.  CABG. 2.  Mitral valve repair.  3.  Total knee replacement.  4.  Percutaneous stent interventions  in his legs for peripheral disease.  5.  Cataract surgery. 6.  Peritoneal dialysis catheter, which has since been removed.   ALLERGIES: ALLERGIC TO IRON PER CHART.   SOCIAL HISTORY: History of serial smoking, none now. No alcohol noted. Wife takes care of him at home. He is retired.   FAMILY HISTORY: Not noted and likely not relevant to the current situation in any event.   MEDICATIONS: Per med reconciliation done in the Emergency Room showed warfarin 3 mg tablet once a day and 1/2 of a 3 mg tablet once a day. Vitamin D 1000 international units daily, torsemide 20 mg daily, sucralfate 1 gram t.i.d., pantoprazole 40 mg daily, metronidazole 500 mg q. 8 hours, Lantus 30 units daily, Crestor 20 units daily, ciprofloxacin 500 mg b.i.d., Bayer aspirin daily, acetaminophen p.r.n.  PHYSICAL EXAMINATION: GENERAL:  Pleasant male looking chronically ill. No acute distress.  VITAL SIGNS: Temperature 97.5 orally, pulse 117 on admission, now down to 90s. Respirations 28 on admission, now down to low 20s. Blood pressure on admission is 79/63 up to 90 systolic after some IV fluid. Pulse ox 91% on room air. He weighed 83.4 kg with a BMI of 26.3.  HEENT: Normocephalic, atraumatic. TMs clear.  NECK: No bruits, thyromegaly, adenopathy or JVD. CHEST:  Clear to auscultation and percussion on inspection.   HEART:  Irregularly irregular. No murmurs or gallops. Peripheral pulses decreased. ABDOMEN: Distended, quiet, mildly tender diffusely.  EXTREMITIES: No clubbing, cyanosis or edema.  NEUROLOGIC: Grossly nonfocal.  SKIN: No lesions noted.  LABORATORY DATA:  Labs showed hemoglobin 7.7, WBC of 9.7, platelets 217, lipase 298, troponin 0.21. C. diff toxin negative on the stool. INR is 1.6. 3 way of the abdomen is unremarkable. Lactic acid is elevated at 5.4. CT of the abdomen and pelvis showed fluid collection around the drain as noted. His creatinine level was 6.2 with normal electrolytes or albumin is markedly  decreased at 2.4.  ASSESSMENT AND PLAN:  Hypotension with a shock-like situation possibly hypovolemia though fluid volume is difficult given his end-stage renal disease. We will give a bolus of another liter every 2 hours and see how that affects his blood pressure and oxygenation. We will hold his metronidazole with a negative Clostridium difficile toxin and diarrhea that may be associated with that as a side effect. Continue the Invanz that was started in the Emergency Room. It seems like the rePincus Sanesasonable choice given the possible ongoing abscess. Blood cultures have been ordered. We will await those. Await a surgical consult that was asked for earlier in the Emergency Room and can be obtained later as the surgeon was operating at the time. This is per report. Also, we will consult the nephrologist given end-stage renal disease, likely necessity of dialysis. Put him on sliding scale insulin for now. Continue the Protonix, IV Zofran, as well as IV morphine. Will try to decrease his medications as needed. Holding the warfarin presently in preparation for any potential surgery that may be needed given the continued abscess.    ____________________________ Marya AmslerMarshall W. Dareen PianoAnderson, MD mwa:ce D: 06/16/2013 18:20:48 ET T: 06/16/2013 18:41:06 ET JOB#: [id number]  cc: Marya AmslerMarshall W. Dareen PianoAnderson, MD, <Dictator> Lauro RegulusMARSHALL W ANDERSON MD ELECTRONICALLY SIGNED 06/18/2013 7:45

## 2014-10-21 NOTE — Op Note (Signed)
PATIENT NAME:  Jerry NevinHEATH, Jerry R MR#:  [id number] DATE OF BIRTH:  August 27, 1932  DATE OF PROCEDURE:  10/22/2012  PREOPERATIVE DIAGNOSES: 1.  End-stage renal disease.  2.  Steal syndrome, right arm,. 3.  Status post right brachiocephalic arteriovenous fistula creation.  4.  Recent insertion of peritoneal dialysis catheter.  5.  Hypertension.  6.  Diabetes.  7.  Heart disease.   POSTOPERATIVE DIAGNOSES:  1.  End-stage renal disease.  2.  Steal syndrome, right arm,. 3.  Status post right brachiocephalic arteriovenous fistula creation.  4.  Recent insertion of peritoneal dialysis catheter.  5.  Hypertension.  6.  Diabetes.  7.  Heart disease.   PROCEDURE: Ligation of right arm brachiocephalic arteriovenous fistula.   SURGEON: Annice NeedyJason S. Dew, M.D.   ANESTHESIA: MAC.   ESTIMATED BLOOD LOSS: Minimal.   INDICATION FOR PROCEDURE: The patient is a 81 year old white male well-known to our service. He has peripheral vascular disease and lower extremity ulcerations we are also treating. He has end-stage renal disease. He had to have his peritoneal dialysis catheter removed several months ago for Pseudomonas peritonitis. A fistula was placed as it was felt he may be on hemodialysis for quite some time; however, he has recently had his peritoneal dialysis catheter replaced and will be starting peritoneal dialysis in the near future. It is flushing well. His right brachiocephalic AV fistula was created a couple of months ago. It is maturing well, but he has steal symptoms of his right hand.  We are going to ligate his fistula as he will not be using it and ligating the fistula should resolve his steal symptoms.   DESCRIPTION OF PROCEDURE: The patient is brought to the operative suite and after an adequate level of intravenous sedation is obtained, the right upper extremity is sterilely prepped and draped and a sterile surgical field was created. A small transverse incision was created overlying the palpable  thrill, in the AV fistula, about 5 to 6 cm from its origin.  I dissected down to the fistula and encircled it without difficulty and ligated the fistula with two 0 silk sutures at this location. This resulted in no palpable blood flow in the fistula beyond the area of ligation in in the upper arm. At this point, I elected to terminate the procedure. The wound was closed with 3-0 Vicryl and 4-0 Monocryl. Dermabond was placed as a dressing. The patient tolerated the procedure well and was taken to the recovery room in stable condition. ____________________________ Annice NeedyJason S. Dew, MD jsd:sb D: 10/22/2012 14:48:30 ET T: 10/22/2012 14:53:23 ET JOB#: [id number]  cc: Annice NeedyJason S. Dew, MD, <Dictator> Annice NeedyJASON S DEW MD ELECTRONICALLY SIGNED 10/26/2012 9:29

## 2014-10-21 NOTE — Op Note (Signed)
PATIENT NAME:  Jerry NevinHEATH, Jerry Allen MR#:  [id number] DATE OF BIRTH:  30-Mar-1933  DATE OF PROCEDURE:  12/29/2012  PREOPERATIVE DIAGNOSES: 1.  Persistent pseudoaneurysm.  2.  Complication of vascular procedure with arterial thrombosis.   POSTOPERATIVE DIAGNOSES:  1.  Persistent pseudoaneurysm.  2.  Patency of arterial system.  No evidence of propagation of thrombus into the native arterial system.   PROCEDURE:  1.  Repeat thrombin injection of left groin pseudoaneurysm.  2.  Percutaneous arteriography of the left lower extremity via right groin approach, third order catheter placement with additional third order.   SURGEON:  Levora DredgeGregory Schnier, M.D.   SEDATION:  Versed 3 mg plus fentanyl 100 mcg administered IV.  Continuous ECG, pulse oximetry and cardiopulmonary monitoring was performed throughout the entire procedure by the interventional radiology nurse.  Total sedation time is 1 hour 45 minutes.   ACCESS:   1.  Direct puncture of the pseudoaneurysm for thrombin injection.  2.  A 5 French sheath, right common femoral artery.   CONTRAST USED:  Isovue 35 mL.   FLUOROSCOPY TIME:  7.8 minutes.   INDICATIONS:  Mr. Jerry Allen is a 81 year old gentleman who underwent thrombin injection of the left groin pseudoaneurysm earlier today.  Follow-up ultrasound in vascular radiology demonstrated persistence of the pseudoaneurysm in the inferior aspect of the aneurysm.  Previously the aneurysm measured approximately 4.5 cm with flow throughout.  There is now a 2 cm segment that has persistent flow.  Given the patient's overall health, he is at high risk for any surgery and therefore I felt re-injection is appropriate.  Risks and benefits were reviewed with the patient and his wife.  All questions were answered and he agrees to proceed.   DESCRIPTION OF PROCEDURE:  The patient is taken to special procedures and placed in the supine position and after adequate sedation is achieved the left groin is prepped and draped  in a sterile fashion.  Ultrasound is placed in a sterile sleeve and the persistent area of flow within the pseudoaneurysm identified.  1% lidocaine is used to anesthetize the skin and a micropuncture needle is affixed to a syringe containing thrombin.  Under direct ultrasound visualization, the microneedle with its echogenic tip is positioned with persistent area of flow and a small amount of thrombin is injected.  Following the injection, there is complete cessation of flow within the entire aneurysm.  Unfortunately, flow cannot be identified in the common femoral or SFA.  Despite repeated angles as well as different orientations, the common femoral and SFA are not identified within the large hematoma in edematous area.  This raised an obvious concern for creation of an ischemic left limb secondary to a thrombus propagating from the pseudoaneurysm into the native arterial system.  Based on this and the fact that the thrombus would be new and relatively easily treated with aspiration thrombectomy, it was elected to perform arteriography.  I broke scrub and went and discussed the situation with the patient's wife, who consented to arteriography and intervention if needed.   The patient's right groin was then prepped and draped in a sterile fashion.  Ultrasound was again placed in a sterile sleeve.  Common femoral artery on the right was identified.  It was echolucent and pulsatile indicating patency.  Images recorded for the permanent record.  1% lidocaine is infiltrated and under direct ultrasound visualization, access using a micropuncture needle is obtained to the common femoral artery.  A microwire followed by micro sheath, J-wire followed by a  5 French sheath.  A 5 French rim catheter and a stiff angled Glidewire with ultimately a floppy Glidewire are then used to cross the aortic bifurcation.  Rim catheter is then exchanged for a straight glide catheter which is negotiated down into the profunda femoris.   Hand injection of contrast demonstrates flow throughout the profunda.  There is no evidence of distal thrombus or emboli.  The catheter is then pulled back into the common femoral where a Glidewire was used to negotiate into the superficial femoral artery representing an additional third order catheter placement and complete distal runoff is obtained to ensure that there is no thrombus or other particulate matter within the distal lower extremity.  Magnified views in both AP as well as oblique are then obtained of the external iliac and common femoral on the left.  All images demonstrate that the arterial tree is widely patent without evidence of a thrombus or embolus.  Also of note, there is no evidence in any of the views of persistence of the pseudoaneurysm.   The catheter is then removed.  Oblique view of the right groin is obtained and a Mynx device is deployed without difficulty.  There are no further complications.   INTERPRETATION:  Initial views with the duplex ultrasound demonstrate persistence of the pseudoaneurysm, although significantly improved compared to the images earlier in the day.  The patient is anticoagulated with Coumadin and given the situation I believe his pseudoaneurysm would persistent and therefore further treatment was warranted.  Following a repeat injection there is now complete resolution of the pseudoaneurysm and this is confirmed with angiography.  Unfortunately, as noted above, there was concern for propagation of thrombus into the arterial system and this is the indication for arteriography.  Angiography demonstrated wide patency of the arterial system and the case was then terminated.   SUMMARY:  Successful treatment of the pseudoaneurysm.  Concerns for propagation of thrombus into the arterial system was not present and therefore the patient is subsequently improved at discharge.    ____________________________ Jerry Dills, MD ggs:ea D: 12/30/2012 18:29:13  ET T: 12/31/2012 01:33:09 ET JOB#: [id number]  cc: Jerry Dills, MD, <Dictator> Jerry Dills, MD Danella Penton, MD Jerry Dills MD ELECTRONICALLY SIGNED 01/05/2013 14:30

## 2014-10-21 NOTE — Consult Note (Signed)
Brief Consult Note: Diagnosis: phlebothrombosis left forearm.   Patient was seen by consultant.   Recommend further assessment or treatment.   Orders entered.   Comments: 81 year old male in intensive care unit for gastrointestinal bleed, sepsis, and renal failure complains of left forearm pain since blood draw 5-6 days ago.  No history of injury.  No fever or elevation of white blood count.    Exam:  tender over left midd forearm radially.  Bruising and induration present.  No erythema or increased heat present.  circulation/sensation/motor function good distally.  Wrist/elbow range of motion good.    X-rays:  no fracture or bony involvement.  calcification of arterial tree.   Imp:  phlebothrombosis  RX:  k-pad/moist heat        observe for any developement of cellulitis.  Electronic Signatures: Valinda HoarMiller, Ruthanne Mcneish E (MD)  (Signed 24-Dec-14 12:43)  Authored: Brief Consult Note   Last Updated: 24-Dec-14 12:43 by Valinda HoarMiller, Kymani Laursen E (MD)

## 2014-10-21 NOTE — Consult Note (Signed)
PT CC appendiceal abcess. Multiple medical problems well covered in Renal doctor note.  Could advance to full liquids and see how he does.  VSS afeb, 100% on 4L. Drain still in, likely to be there for weeks.  No vomiting and nausea relieved after phenergan.  Electronic Signatures: Scot JunElliott, Maja Mccaffery T (MD)  (Signed on 21-Dec-14 09:41)  Authored  Last Updated: 21-Dec-14 09:41 by Scot JunElliott, Azlin Zilberman T (MD)

## 2014-10-21 NOTE — Op Note (Signed)
PATIENT NAME:  Jerry NevinHEATH, Raul R MR#:  [id number] DATE OF BIRTH:  1933-03-04  DATE OF PROCEDURE:  10/29/2012  PREOPERATIVE DIAGNOSES:  1.  Peripheral arterial disease with ulceration, bilateral lower extremities.  2.  End-stage renal disease.  3.  Diabetes.  4.  Hypertension.  5.  Status post left lower extremity revascularization for ulceration.   POSTOPERATIVE DIAGNOSES: 1.  Peripheral arterial disease with ulceration, bilateral lower extremities.  2.  End-stage renal disease.  3.  Diabetes.  4.  Hypertension.  5.  Status post left lower extremity revascularization for ulceration.   PROCEDURE: 1.  Ultrasound guidance for vascular access, left femoral artery.  2.  Catheter placement into right popliteal artery from left femoral approach.  3.  Aortogram and selective right lower extremity angiogram.  4.  Percutaneous transluminal angioplasty of right distal superficial femoral artery and above-knee popliteal artery with 5-mm and 6-mm diameter angioplasty balloon.   SURGEON:  Annice NeedyJason S Michaeljohn Biss, M.D.   ANESTHESIA:  Local with moderate conscious sedation.   ESTIMATED BLOOD LOSS:  Approximately 50 mL fluoroscopy time:   FLUOROSCOPY TIME:  12 minutes.   CONTRAST USED:  58 mL.   INDICATION FOR PROCEDURE:  This is a 81 year old white male with bilateral lower extremity ulcerations. He has undergone previous left lower extremity revascularization. He is brought in for an attempt at revascularization of the right lower extremity. The risks and benefits were discussed. Informed consent was obtained.  DESCRIPTION OF PROCEDURE:  The patient is brought to the Vascular and Interventional Radiology Suite. The groins were shaved and prepped and a sterile surgical field was created. The left femoral artery was visualized with ultrasound and found to be patent. It was then accessed under direct ultrasound guidance without difficulty with a Seldinger needle. A J-wire was placed. Due to scar tissue and disease  in the femoral artery, the initial 5-French sheath did not pass well and I had to place a 4-French dilator and an Amplatz Super Stiff wire to get the 5-French sheath to go. This did allow its passage and a pigtail catheter was placed in the aorta and an AP aortogram was performed. The iliac interventions done previously appeared to be patent. There was an extremely steep aortic bifurcation with significant tortuosity of the iliacs which made crossing the aortic bifurcation quite difficult. This was known from his previous interventions. I then used a rim catheter, advanced the Advantage wire and had to get purchase down into the SFA to be able to advance the catheter to the right femoral head. Selective right lower extremity angiogram was then performed. This showed the SFA to be a very calcified, but not stenotic proximally. In the distal SFA, the SFA was stenotic and occluded at Hunter's canal, reconstituted in the above-knee popliteal artery, which had some stenosis. The below-knee popliteal artery was patent and there was then 2-vessel runoff to the foot with the posterior tibial artery being dominant. The patient was heparinized. An attempt at placing a 6-French sheath was unsuccessful. A 5-French shuttle sheath would not track. I was able to get a 5-French Raby into the right iliac system, and work through this. I was able to cross the lesion with the Advantage wire and had a Kumpe catheter for assistance. I then performed percutaneous transluminal angioplasty of the right SFA and above-knee popliteal artery initially with a 5-mm diameter angioplasty balloon with suboptimal result and I upsized to a 6-mm diameter angioplasty balloon, and with this, there was significantly improved flow with no  stenoses greater than 30% residual and his runoff distally was preserved. At this point, I elected to terminate the procedure. The sheath was removed, a StarClose closure device deployed in the left groin with suboptimal  hemostasis achieved so manual pressure was held for 20 minutes. A sterile dressing was placed, and the patient tolerated the procedure well and was taken to the Recovery Room in stable condition.  ____________________________ Annice Needy, MD jsd:jm D: 10/29/2012 10:33:35 ET T: 10/29/2012 10:57:46 ET JOB#: [id number]  cc: Annice Needy, MD, <Dictator> Danella Penton, MD Munsoor Lizabeth Leyden, MD Annice Needy MD ELECTRONICALLY SIGNED 11/05/2012 15:51

## 2014-10-21 NOTE — Discharge Summary (Signed)
ADDENDUM  PATIENT NAME:  Jerry NevinHEATH, Jerry Allen MR#:  [id number] DATE OF BIRTH:  June 23, 1933  DATE OF ADMISSION:  07/13/2012 DATE OF DISCHARGE:    DISCHARGE MEDICINES: He will have torsemide 20 mg 1 daily and will not have the enalapril and not have the metoprolol, so you can take off metoprolol tartrate and enalapril from the discharge medications.    ____________________________ Danella PentonMark F. Anaka Beazer, MD mfm:aw D: 07/27/2012 08:18:07 ET T: 07/27/2012 08:49:30 ET JOB#: [id number]  cc: Danella PentonMark F. Linnette Panella, MD, <Dictator> Bergen Magner Sherlene ShamsF Humbert Morozov MD ELECTRONICALLY SIGNED 07/27/2012 16:33

## 2014-10-21 NOTE — Consult Note (Signed)
General Aspect 81 year old male with history of CAD, atrial fibrillation, moderate mitral valve stenosis with mild to moderate segmental LV dysfunction, EF 35, 40% congestive heart failure, end-stage renal disease with peritoneal dialysis that started having nausea and vomiting Sunday and unable to hold down liquids, food or medicine and presented to the hospital on Monday.  He has had mildly elevated cardiac enzymes without any acute EKG changes.  He is in chronic atrial fibrillation, rate controlled, on Coumadin.    The patient reports his peritoneal dialysis appeared milky today.  He is on antibiotics for possible infection.  His abdominal discomfort is much better.  He was able to eat breakfast and lunch and hold his food down.  He denies any type of chest pain, shortness of breath.  He does have chronic lower extremity edema which is unchanged. He was admitted to the unit but was moved to a floor bed this p.m.   Physical Exam:   GEN no acute distress    HEENT pink conjunctivae, moist oral mucosa    RESP normal resp effort  clear BS    CARD Irregular rate and rhythm  Murmur    Murmur Systolic    Systolic Murmur axilla    EXTR positive edema    SKIN skin turgor decreased    NEURO cranial nerves intact, motor/sensory function intact    PSYCH alert, A+O to time, place, person, good insight   Review of Systems:   Subjective/Chief Complaint n/v    Cardiovascular: Edema  Chronic    Gastrointestinal: Nausea  Resolved    Review of Systems: All other systems were reviewed and found to be negative   Lab Results: Routine Chem:  14-Jan-14 03:27    Result Comment troponin - RESULTS VERIFIED BY REPEAT TESTING.  - READ-BACK PROCESS PERFORMED.  - c/amos walker 1/14/14at [id number]  Result(s) reported on 14 Jul 2012 at 04:29AM.    09:22    Result Comment TROPONIN - RESULTS VERIFIED BY REPEAT TESTING.  - PREV. C/ 07-14-12  BY NBB. AJO  Result(s) reported on 14 Jul 2012 at 10:09AM.   Cardiac:  14-Jan-14 03:27    Troponin I  0.16 (0.00-0.05 0.05 ng/mL or less: NEGATIVE  Repeat testing in 3-6 hrs  if clinically indicated. >0.05 ng/mL: POTENTIAL  MYOCARDIAL INJURY. Repeat  testing in 3-6 hrs if  clinically indicated. NOTE: An increase or decrease  of 30% or more on serial  testing suggests a  clinically important change)    09:22    Troponin I  0.17 (0.00-0.05 0.05 ng/mL or less: NEGATIVE  Repeat testing in 3-6 hrs  if clinically indicated. >0.05 ng/mL: POTENTIAL  MYOCARDIAL INJURY. Repeat  testing in 3-6 hrs if  clinically indicated. NOTE: An increase or decrease  of 30% or more on serial  testing suggests a  clinically important change)    Iron: N/V, GI Distress  Vital Signs/Nurse's Notes: **Vital Signs.:   14-Jan-14 13:56   Vital Signs Type Admission   Celsius 36.5   Pulse Pulse 85   Respirations Respirations 24   Systolic BP Systolic BP 128   Diastolic BP (mmHg) Diastolic BP (mmHg) 69   Mean BP 88   Pulse Ox % Pulse Ox % 100   Oxygen Delivery 2L     Impression 81 year old male admitted with abdominal pain, nausea, vomiting resolved, with CAD, history of congestive heart failure, chronic Atrial fibrillation, LV dysfunction, moderate mitral stenosis with mildly elevated troponins and no acute changes by EKG consistent  with current illness,  demand ischemia and not acute coronary syndrome.    Plan 1. Continue current treatment of cardiac issues without change. 2  Continue current course of therapy for nausea, vomiting, abdominal pain, with patient showing improvement. 3.  No further cardiac intervention is needed.  Patient was seen in collaboration with Dr. Gwen PoundsKowalski.   Electronic Signatures: Rudi Cocoarroll, Donna (NP)  (Signed 14-Jan-14 16:56)  Authored: General Aspect/Present Illness, History and Physical Exam, Review of System, Labs, Allergies, Vital Signs/Nurse's Notes, Impression/Plan   Last Updated: 14-Jan-14 16:56 by Rudi Cocoarroll, Donna (NP)

## 2014-10-21 NOTE — Op Note (Signed)
PATIENT NAME:  Jerry Allen, Jerry Allen MR#:  [id number] DATE OF BIRTH:  [birth year]  DATE OF PROCEDURE:  11/20/2012  PREOPERATIVE DIAGNOSES:  1.  Complication of dialysis device with nonfunctional peritoneal dialysis catheter.  2.  End-stage renal disease requiring hemodialysis.  POSTOPERATIVE DIAGNOSES:  1.  Complication of dialysis device with nonfunctional peritoneal dialysis catheter.  2.  End-stage renal disease requiring hemodialysis.   PROCEDURES PERFORMED: 1.  Laparoscopy of the abdominal cavity with lysis of adhesions.  2.  Removal of existing peritoneal dialysis catheter.   SURGEON:  Renford Dills, M.D.   ANESTHESIA:  General by endotracheal intubation.   FLUIDS:  Per anesthesia record.   ESTIMATED BLOOD LOSS:  Minimal.   SPECIMENS:  Catheter, which is photographed for the permanent record.   INDICATIONS:  Jerry Allen is a 81 year old gentleman who has had multiple problems with his peritoneal dialysis catheter since a significant episode of peritonitis several months ago.  At the present time he has been converted to hemodialysis and he is now undergoing evaluation for either revision and/or replacement of his peritoneal catheter.  Risks and benefits were reviewed.  All questions answered.  The patient agrees to proceed.   DESCRIPTION OF PROCEDURE:  The patient is taken to the operating room, placed in the supine position.  After adequate general anesthesia is induced and appropriate invasive monitors are placed, he is positioned supine and his abdomen is prepped and draped in a sterile fashion.  A small midline incision is made just above the umbilicus in an area that has been previously unaccessed and after exposing the fascia, a Veress needle is introduced.  Insufflation of the abdomen is performed.  An 11 mm trocar is then introduced.  The camera is then advanced.  Inspection of all four quadrants of the abdomen now show there are massive amounts of adhesions, particularly in the  pelvis, but the adhesions extend up to the right upper quadrant with adhesions to the liver as well.  The catheter is finally identified coursing straight across the lower abdomen and is intertwined with multiple loops of small intestine as well as extensive adhesions attaching the catheter to the posterior surface of the abdominal wall.  Further inspection trying to see if there is a window to allow a catheter to be passed into the pelvis demonstrates that there are multiple adhesions of the small bowel bilaterally and there is not a free area with which a new catheter could be positioned.   Adhesions to the catheter are then gently taken down and once the catheter has been freed, the camera and trocar are removed.  A small second incision is made over the cuff which is palpable and the catheter is exposed.  The cuff is then dissected free.  The catheter is then transected and the intraperitoneal part removed without difficulty.  Pursestring suture of 0 Vicryl is placed around the exit site and this is then sealed.  The external portion of the catheter is then placed under traction and the proximal cuff is dissected free.  This other portion of the catheter is then removed.   The fascia is then closed at the trocar site using interrupted 0 Vicryl.  The skin is then reapproximated after the subcutaneous tissues have been reapproximated with Monocryl and Dermabond is applied.  Sterile dressing is applied to the exit site.  The patient tolerated the procedure well.  He extubated without difficulty and is taken to the recovery room in stable condition.    ____________________________  Renford DillsGregory G. Kahealani Yankovich, MD ggs:ea D: 11/20/2012 19:07:09 ET T: 11/20/2012 23:30:04 ET JOB#: [id number]  cc: Renford DillsGregory G. Sheyann Sulton, MD, <Dictator> Renford DillsGregory G. Monterio Bob, MD Jerry Lizabeth LeydenN. Lateef, MD Renford DillsGREGORY G Auriana Scalia MD ELECTRONICALLY SIGNED 11/24/2012 8:53

## 2014-10-21 NOTE — Consult Note (Signed)
PATIENT NAME:  Jerry NevinHEATH, Jerry Allen MR#:  [id number] DATE OF BIRTH:  1933-01-26  DATE OF CONSULTATION:  02/12/2013  CONSULTING PHYSICIAN:  Enid Baasadhika Dio Giller, MD  ADMITTING PHYSICIAN:  Dr. Levora DredgeGregory Schnier  REASON FOR CONSULTATION: Management of postprocedural dyspnea, and also hypertension.  PRIMARY PHYSICIAN:  Dr. Bethann PunchesMark Miller  PRIMARY NEPHROLOGIST:  Dr.  Wynelle LinkKolluru   PRIMARY CARDIOLOGIST:  Dr. Darrold JunkerParaschos   BRIEF HISTORY:  Jerry Allen is a  81 year old, pleasant Caucasian male, with multiple medical problems including coronary artery disease, status post bypass graft surgery, chronic atrial fibrillation on Coumadin, diabetes mellitus, peripheral vascular disease, bilateral lower extremity edema from venous insufficiency, mitral valve repair, end-stage renal disease on hemodialysis. Jerry Allen had an outpatient procedure for AV fistula DRIL on his right arm done. Post-procedure, the patient experienced dyspnea and had a chest x-ray, which showed pulmonary edema. The patient does make a minimal amount of urine, so Jerry Allen received 40 mg of IV Lasix, and had about 50 mL of urine after that. His dyspnea has improved. Jerry Allen was seen by Nephrology, who scheduled him for dialysis tomorrow morning. His last dialysis was on Wednesday, and Jerry Allen was originally due for dialysis today. The patient remained hypotensive. His systolic blood pressure in the 80s on phenylephrine drip, so Jerry Allen is being admitted for observation overnight until his blood pressure improves and his dyspnea resolves. The patient has only minimal pain around the procedure side, but denies any chest pain at this time. No nausea, vomiting, or other complaints. His dyspnea has improved significantly.   PAST MEDICAL HISTORY: 1.  Chronic atrial fibrillation, on Coumadin.  2.  Coronary artery disease, status post CABG.  3.  Osteoarthritis.  4.  Diabetes mellitus.  5.  Hypertension.  6.  Bilateral lower extremity edema.  7.  Peripheral vascular disease.  8.  End-stage renal  disease, on Monday, Wednesday, Friday hemodialysis.   PAST SURGICAL HISTORY: 1.  Mitral valve repair.  2.  Total knee replacement  3.  AV fistula ligation.  4.  AV fistula DRIL on the right arm.  5.  Peritoneal dialysis catheter placement and removal.  6. Pseudoaneurysm repair and angiogram and angioplasty with stent placement in his legs for peripheral vascular disease in the past.  7.  Coronary artery bypass graft surgery.  8.  Cataract surgery.  ALLERGIES TO MEDICATIONS:  IRON TABLETS.   HOME MEDICATIONS: 1.  Aspirin 81 mg p.o. daily.  2.  Calcium acetate 667 mg capsules daily.  3.  Centrum heart capsule 1 tablet p.o. daily.  4.  Crestor 20 mg p.o. at bedtime.  5.  Lantus 30 units subcu once a day in the morning.  6.  Protonix 40 mg p.o. in the morning.  7.  Sucralfate 1 gram 3 times a day orally with meals.  8.  Torsemide 20 mg p.o. daily.  9.  Vitamin D3, 1000 international units p.o. daily.  10.  Warfarin 3 mg on Monday, Wednesday, Friday, 0.5 mg on Tuesday, Thursday, Saturday and Sunday.   SOCIAL HISTORY:  Jerry Allen lives at home with his wife. Stopped smoking about 10 years ago. No alcohol use. Ambulates with a walker, but decreased ambulation lately due to worsening arthritis.   FAMILY HISTORY:  Sister with breast cancer, father with heart disease, brother with heart disease and also liver cancer.   REVIEW OF SYSTEMS:   CONSTITUTIONAL:  No fatigue, weakness or fevers.  EYES:  Cataract surgery.   PAST SURGICAL HISTORY: EYES:  No blurry vision, double vision, glaucoma.  History of cataracts, status post surgery. Uses reading glasses.  EARS, NOSE, THROAT:  No tinnitus, ear pain. Positive for hearing loss. No epistaxis or discharge. No dysphagia.  RESPIRATORY:  No cough, wheeze, hemoptysis. Positive for COPD, and uses nocturnal home oxygen.  CARDIOVASCULAR:  No chest pain, orthopnea, edema, arrhythmia, palpitations. Positive for dyspnea on exertion. No syncope.  GASTROINTESTINAL:  No nausea, vomiting, diarrhea, abdominal pain, hematemesis or melena.  GENITOURINARY:  No dysuria or hematuria. The patient is oliguric and a dialysis patient. ENDOCRINE:  No polyuria, nocturia, thyroid problems, heat or cold intolerance.  HEMATOLOGY:  No anemia, easy bruising or bleeding.  SKIN:  No acne, rash or lesions.  MUSCULOSKELETAL:  Positive for arthritis. No gout.  NEUROLOGIC:  No numbness, weakness, CVA, TIA, or seizures.  PSYCHOLOGICAL:  No anxiety, insomnia or depression.   PHYSICAL EXAMINATION: VITAL SIGNS:  Temperature is afebrile, pulse is 90, blood pressure is 84/67, sats are 94% on 2 liters oxygen, respirations are 20.  GENERAL EXAM:  A well-built, well-nourished male lying in bed, not in any acute distress.  HEENT:  Normocephalic, atraumatic. Pupils are equal postsurgical, reacting to light. Extraocular movements are intact. Anicteric sclerae. Mild conjunctival redness seen in the right eye. Oropharynx is clear, without erythema, mass or exudates.  NECK:  Supple. No thyromegaly, JVD or carotid bruits. No lymphadenopathy.  LUNGS:  Moving air bilaterally. No wheeze or crackles. Decreased bibasilar breath sounds and fine crackles at the bases. Minimal use of accessory muscles on exertion.  CARDIOVASCULAR:  S1, S2. A loud 4/6 murmur heard in the aortic area. No rubs or gallops.  ABDOMEN:  Soft, nontender, nondistended. No hepatosplenomegaly. Normal bowel sounds.  EXTREMITIES:  The patient has compression stockings and wrapped bilateral lower extremities secondary to his chronic edema. Jerry Allen has poor dorsalis pedis pulses bilaterally. Has good popliteal and femoral pulses, though. No clubbing or cyanosis. His right arm fistula with sutures is present.  LYMPHATICS:  No cervical or inguinal lymphadenopathy.  NEUROLOGIC:  Cranial nerves II through XII remain intact. Sensation is intact. Motor strength is 5/5 in all 4 extremities.  PSYCHOLOGIC:  The patient is awake, alert, oriented x 3.    LABORATORY DATA:  WBC is 5.9, hemoglobin 10.3, hematocrit 31.6, platelet count 150. Sodium 136, potassium 3.9, chloride 100, bicarb 30, BUN 61, creatinine 4.5, glucose of 168, and calcium of 9.4. INR is 1.5. Chest x-ray showing congestive heart failure and mild interstitial edema with small bilateral pleural effusion findings.   RECOMMENDATIONS:  A 81 year old male with a history of diabetes, coronary artery disease, status post bypass graft surgery, peripheral vascular disease, end-stage renal disease on Monday, Wednesday, Friday hemodialysis, who came for outpatient fistula placement and was noted to be dyspneic post-procedure and also hypotensive.   1.  Post-procedure dyspnea secondary to pulmonary edema. The patient is due for dialysis today. Jerry Allen received one dose of Lasix, one-time dose, and had only 4 to 5 mL of urine because Jerry Allen is oliguric and a dialysis patient. Continue his torsemide, and Nephrology was notified. The patient will get dialysis in the morning. His sats are well-maintained on 2 liters nasal cannula. Continue oxygen support.   2.  Hypotension. Known history of ischemic cardiomyopathy. Last known ejection fraction of 30% from echo in 2012. Continue phenylephrine drip and wean as tolerated. Likely triggered by the sedation medications given during the procedure. The patient is not on any antihypertensives at home, and states his blood pressure kind of runs on the lower side.  3.  End-stage renal disease. The patient has Perm-Cath fistula currently for dialysis, and his aspirin and Coumadin are on hold, per Vascular team. Jerry Allen has a fistula placed on the right arm by Vascular team. Nephrology has been consulted, and patient will have dialysis tomorrow.   4.  Chronic atrial fibrillation, rate well-controlled. Jerry Allen is not on any beta blockers or calcium channel blockers. Not sure if it is from hypotension. Jerry Allen follows with Dr. Darrold Junker. His Coumadin is currently on hold. INR is 1.5.   5.   Coronary artery disease, status post bypass graft surgery. Appears stable. Had a recent nuclear stress test in the last month, which was normal, according to the patient. Continue medical management.   6.  Diabetes mellitus. Continue his Lantus.   7.  Gastrointestinal and deep vein thrombosis prophylaxis. On Protonix, and Jerry Allen already has stockings for his edema. Not on his Coumadin because it is held from his vascular procedure.    Total time spent on consultation is 50 minutes.    ____________________________ Enid Baas, MD rk:mr D: 02/12/2013 18:22:53 ET T: 02/12/2013 21:30:15 ET JOB#: [id number]  cc: Enid Baas, MD, <Dictator> Danella Penton, MD Marcina Millard, MD Laverda Sorenson, MD    Enid Baas MD ELECTRONICALLY SIGNED 02/22/2013 22:13

## 2014-10-21 NOTE — Discharge Summary (Signed)
PATIENT NAME:  Jerry NevinHEATH, Jerry R MR#:  [id number] DATE OF BIRTH:  05-31-1933  DATE OF ADMISSION:  02/12/2013  DATE OF DISCHARGE:  02/14/2013  DISCHARGE DIAGNOSES:  1.  Hypotension post-procedure.  2.  End-stage renal disease, requiring hemodialysis.  3.  Coronary artery disease.  4.  Congestive heart failure. 5.  Ischemia left arm, with steal syndrome.   PROCEDURE PERFORMED:   1.  Creation of a right arm drill using CryoVein.  2. Creation of a right arm brachial axillary dialysis graft using 6 mm PTFE, performed 02/12/2013.   CONSULTATIONS:  1.  [facility], medical management.  2.  Nephrology for management of his dialysis.   HISTORY:  [name] is a 81 year old gentleman who presents for planned creation of a right arm upper extremity access simultaneously. The patient will undergo a drill procedure, as the patient has had significant hand ischemia, with patent accesses in the past.   HOSPITAL COURSE: On the day of admission, the patient underwent successful drill procedure, essentially an axillary to distal brachial artery bypass with CryoVein, and subsequently a brachial artery to axillary vein AV graft. There is interval ligation between the distal anastomosis and the AV graft. The operation itself was without complication. The patient was hemodynamically stable throughout the entire time. However, upon transferring the patient to the recovery room, he began having significant hypotension. This required pressor support, and subsequently he was resuscitated and transferred to the Intensive Care Unit, where he remained awake and alert, but was requiring a Neo-Synephrine drip to maintain a mean arterial pressure greater than 60. Over the course of the next 2 days, his hemodynamics improved significantly. He stabilized, was weaned off his Neo-Synephrine. He underwent dialysis while here, and did well. On postoperative day #2, he was felt fit for discharge. He is discharged to home. He will  follow up with his usual dialysis schedule. He will continue his diet as previously noted. Medications are unchanged. He will follow up with me in the office in 1 to 2 weeks for evaluation of his dialysis graft and initiation of access.     ____________________________ Renford DillsGregory G. Auriah Hollings, MD ggs:mr D: 02/23/2013 18:31:00 ET T: 02/23/2013 19:18:31 ET JOB#: [id number]  cc: Marya AmslerMarshall W. Dareen PianoAnderson, MD Renford DillsGregory G. Fabyan Loughmiller, MD, <Dictator> Dr. Nile RiggsSingh  Treyshon Buchanon G Myna Freimark MD ELECTRONICALLY SIGNED 03/12/2013 9:09

## 2014-10-21 NOTE — H&P (Signed)
PATIENT NAME:  Jerry Allen, Jerry Allen MR#:  [id number] DATE OF BIRTH:  08/17/1932  DATE OF ADMISSION:  07/14/2012  PRIMARY CARE PHYSICIAN: Mark F. Miller, MD   REFERRING PHYSICIAN: Lindsey Miller, NP. She is covered by Luna Ragsdale, MD.    CHIEF COMPLAINT: Abdominal pain.   HISTORY OF PRESENT ILLNESS: The patient is a 81-year-old pleasant Caucasian male with history of end-stage renal disease, on peritoneal dialysis, he does that at home, history of systemic hypertension, chronic atrial fibrillation, on chronic anticoagulation with Coumadin. He was in his usual state of health until the last 24 hours, when he started to have abdominal pain at the central abdominal area, but more so towards the right. The pain was described as sharp pain and constant, and it is tender to touch. The severity of the pain increases upon palpation. He had several episodes of dry heaving and vomiting, and he could not eat or drink, but he was able to take his Lantus insulin only. The patient was seen here at the Emergency Department, and evaluation was suspicious for possible acute peritonitis. Additionally, there is minimal elevation of troponin at 0.15; however, in the past records, the patient did have, in the past, elevated troponin. The patient denies having any chest pain.   REVIEW OF SYSTEMS:   CONSTITUTIONAL: Denies any fever. No chills. No fatigue.  EYES: No blurring of vision and no double vision. Left pupil is small but round, which is the good eye. In the right eye, pupil is irregular and he has diminished vision. He had multiple surgeries to this eye.  ENT: No hearing impairment. No sore throat. No dysphagia.  CARDIOVASCULAR: Denies any chest pain. He reports mild shortness of breath, if any. No edema. No syncope.  RESPIRATORY: No cough. No sputum production. No chest pain. No hemoptysis.  GASTROINTESTINAL: Reports abdominal pain, nausea and vomiting as above. No melena. No hematochezia.  GENITOURINARY: No dysuria.  No frequency of urination.  MUSCULOSKELETAL: No joint pain or swelling. No muscular pain or swelling.  INTEGUMENTARY: No skin rash. No ulcers.  NEUROLOGY: No focal weakness. No seizure activity. No headache.  PSYCHIATRY: No anxiety and no depression.  ENDOCRINE: No polyuria or polydipsia. No heat or cold intolerance.  HEMATOLOGY: No easy bruisability. No lymph node enlargement.   PAST MEDICAL HISTORY:  1. End-stage renal disease, on peritoneal dialysis.  2. Severe systemic hypertension.  3. Chronic atrial fibrillation with chronic anticoagulation with Coumadin.  4. Anemia of chronic disease.  5. Severe systemic hypertension.  6. History of coronary artery disease, status post myocardial infarction.  7. There is also a question about heart valve replacement in the past. This is not confirmed or clarified.  8. The patient also has diabetes mellitus type 2, on Lantus insulin.  9. Hypercholesterolemia.   PAST SURGICAL HISTORY: Coronary artery bypass graft in 2004. Multiple surgeries to the right eye, including for muscular degeneration, cataract and detached retina. Remote history of surgery for pilonidal cyst in 1956. Knee joint replacement.   SOCIAL HISTORY: He is married, living with his wife. 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   SOCIAL HABITS: Ex-chronic smoker. He quit 10 years ago after having his heart attack. He has a total history of 40-pack-year smoking. No history of alcohol or other drug abuse.   FAMILY HISTORY: His mother died from complications of dementia. His father died of congestive heart failure at the age of 64.   ADMISSION MEDICATIONS:  1. Aspirin 81 mg   a day.  2. Calcitriol 0.25 mcg once a day.  3. Calcium acetate 667 mg 3 capsules 3 times a day.  4. Centrum Silver 1 tablet once a day. 5. Crestor 20 mg once a day.  6. Enalapril 10 mg once a day. 7. Lantus 30 units twice a day.  8. Metoprolol tartrate 50 mg twice a day. 9.  Mucinex 600 mg once a day.  10. Pantoprazole 40 mg once a day.  11. Sucralfate 1 mg 3 times a day.  12. Torsemide 20 mg twice a day.  13. Vitamin D3 1000 units once a day.  14. Warfarin 3 mg every other day and 1.5 mg every other day.   ALLERGIES: IRON CAUSING SIDE EFFECTS INCLUDING NAUSEA, VOMITING AND GI DISTRESS.   PHYSICAL EXAMINATION:  VITAL SIGNS: His blood pressure 87/48, respiratory rate 20, pulse 82, temperature 98.3, oxygen saturation 99%.  GENERAL APPEARANCE: Elderly male, lying in bed in no acute distress.  HEENT: No pallor. No icterus. No cyanosis. Ear examination revealed normal hearing, no discharge, no lesions. Nasal mucosa was normal, no ulcers, no discharge. Lips and oral cavity were normal, no discharge, no oral thrush, no ulcers. Eye examination revealed normal eyelids and conjunctivae. The right pupil is irregular and does not react to the light. The left pupil is constricted.  NECK: Supple. Trachea at midline. No thyromegaly. No cervical lymphadenopathy. No masses.  HEART: Revealed normal S1, S2. No S3 or S4. No gallop. No murmur. No carotid bruits.  RESPIRATORY: Showed he had normal breathing pattern without use of accessory muscles. No rales. No wheezing.  ABDOMEN: Obese, soft, with tenderness at the central abdominal area, more so towards the right side of the abdomen. No rigidity. No hernias. The peritoneal dialysis catheter is located at the mid abdomen, and the area looks clean.  MUSCULOSKELETAL: No joint swelling. No clubbing.  SKIN: Revealed no ulcers. No subcutaneous nodules.  NEUROLOGIC: Cranial nerves II through XII are intact. No focal motor deficit.  PSYCHIATRIC: The patient is alert and oriented x3. Mood and affect were normal.   LABORATORY FINDINGS: CT scan of the abdomen showed findings suspicious for early mild inflammatory changes involving the hepatic flexure. There is cardiomegaly, and component of pulmonary edema is raised. Small left effusion. Some  atelectasis. Infrarenal abdominal aortic aneurysm measured 4.6 x 4.4. No evidence of bowel obstruction. No evidence of drainable loculated fluid collection. Chest x-ray showed pulmonary vascular prominence and mild right-sided pleural effusion. Possibly left pleural effusion as well. Serum glucose 199. B-type natriuretic peptide was 32,467. BUN 103, creatinine 10, sodium 136, potassium 3.5. Total protein 7.8, albumin 2.8, normal liver transaminases. Bilirubin 0.5. Troponin is elevated at 0.15. CBC showed a white count of 9000, hemoglobin 10, hematocrit 32, platelet count 183. Prothrombin time 23, INR 2.1. Influenza A and B were negative.   ASSESSMENT:  1. Abdominal pain associated with nausea and vomiting, with suspicion for acute peritonitis secondary to the peritoneal dialysis catheter usage.  2. Elevated troponin. I am not sure about the etiology of that, whether this is related to his renal failure or we are dealing with coronary event. Nevertheless, will follow up on the troponin. The patient is asymptomatic in terms of chest pain.  3. End-stage renal disease, on peritoneal dialysis. 4. Chronic atrial fibrillation.  5. Chronic anticoagulation with Coumadin.  6. Pulmonary vascular congestion, possibly early pulmonary edema. 7. Hypotension. This is either related to sepsis versus cardiac in origin.  8. Coronary artery disease status post coronary artery   bypass graft.  9. History of systemic hypertension.  10. Infrarenal abdominal aortic aneurysm measured 4.6 x 4.4.  11. Anemia of chronic diseases.  PLAN: Will admit the patient to the intensive care unit. Blood cultures were taken. IV antibiotic will be started using vancomycin and Zosyn. Unfortunately, peritoneal fluid was not obtained due to lack of trained nurse to do that, as I was told. Nephrology consultation was obtained, and Dr. Singh came here to see the patient, and he will see him again in the morning. In any way, blood cultures were  taken. Fluid challenge was done at the Emergency Department to improve blood pressure. His latest blood pressure is above 90. I will follow on his troponin level. Cardiology consultation with Dr. Fath. Nephrology consult with Dr. Singh. Due to hypotension, I will hold enalapril, metoprolol and torsemide. Due to the patient's inability to eat or drink due to vomiting and abdominal pain, I will hold Lantus, but I will place him on Accu-Chek and sliding scale. For deep vein thrombosis prophylaxis, the patient is already on anticoagulation with Coumadin.   TIME SPENT IN EVALUATING THIS PATIENT: Took more than 1 hour due to its complexity and reviewing medical records.    ____________________________ Kagan Mutchler M. Trenesha Alcaide, MD amd:OSi D: 07/13/2012 23:53:20 ET T: 07/14/2012 06:29:22 ET JOB#: [id number]  cc: Emeline Simpson M. Aarvi Stotts, MD, <Dictator> Troi Bechtold M Ravis Herne MD ELECTRONICALLY SIGNED 07/14/2012 7:23 

## 2014-10-21 NOTE — Op Note (Signed)
PATIENT NAME:  Jerry Allen#:  Allen DATE OF BIRTH:  Feb 10, 1933  DATE OF PROCEDURE:  02/12/2013  PREOPERATIVE DIAGNOSES: 1.  Steal syndrome, right arm.  2.  End-stage renal disease requiring hemodialysis.  3.  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   POSTOPERATIVE DIAGNOSES: 1.  Steal syndrome, right arm.  2.  End-stage renal disease requiring hemodialysis.  3.  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   PROCEDURE PERFORMED:   1.  Axillary to distal brachial artery bypass with CryoVein and interval ligation.  2.  Creation of a right arm brachial-axillary AV graft using 6 mm PTFE.   SURGEON: Renford DillsGregory G. Schnier, M.D.   FIRST ASSISTANT: Ms. Rico JunkerChelsea Hanne.   ANESTHESIA: General by endotracheal intubation.   FLUIDS: Per anesthesia record.   ESTIMATED BLOOD LOSS: 150 mL.   SPECIMENS: None.   INDICATIONS: The patient presented to the office with increasing pain in his hand. He subsequently required ligation of his fistula and placement of a tunneled dialysis catheter. He has undergone extensive work-up and at this point, our optimal option appears to be reconstruction of a dialysis access in the right arm with a bypass and interval ligation. The risks and benefits have been reviewed, all questions answered. The patient agrees to proceed.   DESCRIPTION OF PROCEDURE: The patient is taken to the Operating Room and placed in the supine position. After adequate general anesthesia is induced and appropriate invasive monitors are placed, he is positioned supine and his right arm is extended palm upward and right arm is prepped and draped in a sterile fashion.   A linear incision is created overlying the previous fistula and the dissection is carried down to expose the remnant of the fistula, which is dilated and still has pulsatile flow, almost saccular structure. It is created of the cephalic vein and will suffice as the cuff for the beginning of  the AV graft. Once this has been dissected circumferentially, attention is turned to the axilla. Linear incision is created and first, the axillary artery is dissected. It is exposed proximally and distally and looped with Silastic vessel loops. Then the axillary vein is exposed. It is of good caliber and is looped proximally and distally.   The distal brachial artery is exposed at the level of the bifurcation, below the antecubital fossa. It is dissected circumferentially and Silastic vessel loops are placed around the origin of the radial and ulnar arteries.   A tunnel is then created along the medial border of the biceps and the CryoVein is prepared on the back table.   The axillary artery is then clamped proximally and distally. The vein has been dilated and flushed with saline. It is transected at appropriate length, spatulated, and a side axillary artery to end CryoVein anastomosis is fashioned using running 6-0 Prolene. Flushing maneuvers are performed and flow is established back to the arm as well as through the CryoVein for flushing purposes, and then the CryoVein is clamped distally. 4000 units of heparin had been given.   The vein is then pulled subcutaneously in the previously described tunnel medial to the biceps. It is then trimmed to the appropriate length and beveled, or spatulated. Arteriotomy is made in the distal brachial artery. 2-0 Ethibond is used to ligate the brachial artery just proximal to the anastomosis, and an anastomosis is fashioned with running 6-0 Prolene. Flushing maneuvers are performed and flow is established through the graft. Hemostasis is noted of both anastomotic areas.  Gore tunneler is then used to create a subcutaneous tunnel from the arterial site to the axillary incision. A 6 mm straight PTFE Propaten graft is then pulled subcutaneously, taking care to orient it properly. End graft to end previous cephalic vein anastomosis is fashioned. Flushing maneuvers are  performed and the graft is then irrigated with sterile saline and clamped. It is approximated to the axillary vein, spatulated. Axillary vein is delivered into the field. Arteriotomy is made and then an end graft to side vein anastomosis is fashioned with running CV6 suture. Flushing maneuvers are performed and flow is established through the graft. Excellent thrill is noted in the graft. A good distal pulse is noted in the arm.   Both wounds were then irrigated and inspected for hemostasis and then closed in layers using 3-0 Vicryl in a running fashion, followed by 4-0 Monocryl subcuticular and Dermabond. The patient tolerated the procedure well. There were no immediate complications. Sponge and needle counts were correct and he was taken to the recovery area in excellent condition.    ____________________________ Renford Dills, MD ggs:jm D: 02/12/2013 15:27:32 ET T: 02/12/2013 17:20:33 ET JOB#: [id number]  cc: Renford Dills, MD, <Dictator> Laverda Sorenson, MD Renford Dills MD ELECTRONICALLY SIGNED 02/17/2013 7:32

## 2014-10-21 NOTE — Consult Note (Signed)
Chief Complaint:   Subjective/Chief Complaint Overall feeling better. Less abd pain.   VITAL SIGNS/ANCILLARY NOTES: **Vital Signs.:   16-Jan-14 10:24   Vital Signs Type Q 4hr   Temperature Temperature (F) 97.7   Celsius 36.5   Temperature Source Oral   Pulse Pulse 82   Respirations Respirations 18   Systolic BP Systolic BP 109   Diastolic BP (mmHg) Diastolic BP (mmHg) 62   Mean BP 77   Pulse Ox % Pulse Ox % 94   Pulse Ox Activity Level  At rest   Oxygen Delivery 1L   Brief Assessment:   Cardiac Regular    Respiratory clear BS    Gastrointestinal mild right sided abd tenderness   Lab Results: BF Analysis:  16-Jan-14 08:09    Body Fluid Source (CC) PERITNL DIALYSATE   Color - (BF) COLORLESS   Clarity (BF) HAZY   NCC  (nucleated cell count) 1069   Neutrophils  (BF) 94   Lymphocytes  (BF) 4   Monocytes/Macrophages  (BF) 2   Eosinophil  (BF) 0   Basophil  (BF) 0   Other Cells  (BF) 0  LabUnknown:  16-Jan-14 08:09    Result Interpretation Body fluid is reviewed. Abundant neutrophils are present with reactive mesothelial cells, scattered monocytes and lymphcytes. No features of malignancy are seen. Findings are consistent with current history of acute peritonitis. Dr. Excell SeltzerBaker  Routine Micro:  16-Jan-14 08:09    Micro Text Report BODY FLUID CULTURE   GRAM STAIN                MANY WHITE BLOOD CELLS   GRAM STAIN                FEW GRAM POSITIVE COCCI   ANTIBIOTIC                        Gram Stain 1 MANY WHITE BLOOD CELLS   Gram Stain 2 FEW GRAM POSITIVE COCCI  Result(s) reported on 16 Jul 2012 at 11:22AM.  Routine Chem:  16-Jan-14 08:09    Result Comment Other Cells - PATHOLOGIST TO REVIEW SMEAR. COMMENTS  - APPEAR ON REPORT WHEN COMPLETE.  Result(s) reported on 16 Jul 2012 at 09:42AM.   Assessment/Plan:  Assessment/Plan:   Assessment Abd pain. Peritonitis. On abx. Questionable colitis on CT. Feeling better.    Plan Continue to treat peritonitis. Hopefully,  abd pain will resolve with abx. Pt not too interested in repeating colonoscopy since done recently in 11/13.   Electronic Signatures: Lutricia Feilh, Vedder (MD)  (Signed 16-Jan-14 11:49)  Authored: Chief Complaint, VITAL SIGNS/ANCILLARY NOTES, Brief Assessment, Lab Results, Assessment/Plan   Last Updated: 16-Jan-14 11:49 by Lutricia Feilh, Abdi (MD)

## 2014-10-21 NOTE — Op Note (Signed)
PATIENT NAME:  Jerry NevinHEATH, Justice R MR#:  [id number] DATE OF BIRTH:  06-Nov-1932  DATE OF PROCEDURE:  12/15/2012  PREOPERATIVE DIAGNOSIS: Left groin pseudoaneurysm.   POSTOPERATIVE DIAGNOSIS: Left groin pseudoaneurysm.   PROCEDURE PERFORMED: Thrombin injection for treatment of left groin pseudoaneurysm.   SURGEON: Renford DillsGregory G. Schnier, MD.   SEDATION: Versed 3 mg IV.   FLUOROSCOPY TIME: None.   CONTRAST: None.   INDICATIONS: [name] is a 81 year old gentleman, who returns to the office following angiography intervention and was found to have a pseudoaneurysm of the left groin. He is now undergoing thrombin injection for treatment. Risks and benefits were reviewed with the patient. All questions answered. The patient agrees to proceed.   DESCRIPTION OF PROCEDURE: The patient is taken to special procedures and placed in the supine position. After adequate sedation is achieved, his left groin is prepped and draped in sterile fashion. Ultrasound is then utilized with color flow and pseudoaneurysm is easily identified. Under direct ultrasound visualization, a micropuncture needle is inserted into the aneurysm and subsequently 1 and 1.5 mL of thrombus is injected. Immediately following this, heterogeneous material is noted and there is absence of color flow, consistent with thrombosis of the pseudoaneurysm with excellent result. The patient tolerated the procedure well. There were no immediate complications and he was taken to the recovery area in excellent condition.   ____________________________ Renford DillsGregory G. Schnier, MD ggs:aw D: 12/16/2012 11:26:11 ET T: 12/16/2012 14:19:51 ET JOB#: [id number]  cc: Renford DillsGregory G. Schnier, MD, <Dictator> Danella PentonMark F. Miller, MD Renford DillsGREGORY G SCHNIER MD ELECTRONICALLY SIGNED 12/16/2012 16:16

## 2014-10-21 NOTE — Consult Note (Signed)
   Comments   I spoke with Dr Burnadette PopLinthavong. Will hold on consult for now.   Electronic Signatures: Christophe Rising, Daryl EasternJoshua R (NP)  (Signed 29-Dec-14 15:42)  Authored: Palliative Care   Last Updated: 29-Dec-14 15:42 by Malachy MoanBorders, Marrio Scribner R (NP)

## 2014-10-21 NOTE — Consult Note (Signed)
Chief Complaint:  Subjective/Chief Complaint Pt denies abdominal pain.  Denies nausea, vomiting, rectal bleeding or melena.  No diarrhea.   VITAL SIGNS/ANCILLARY NOTES: **Vital Signs.:   19-Dec-14 08:00  Vital Signs Type Routine  Temperature Temperature (F) 97.7  Celsius 36.5  Temperature Source oral  Pulse Pulse 78  Respirations Respirations 21  Systolic BP Systolic BP 423  Diastolic BP (mmHg) Diastolic BP (mmHg) 59  Mean BP 72  Pulse Ox % Pulse Ox % 99  Oxygen Delivery 2L; Non Rebreather Mask  Pulse Ox Heart Rate 78   Brief Assessment:  GEN well developed, no acute distress, obese, A/Ox3   Cardiac Regular   Respiratory normal resp effort   Gastrointestinal details normal Soft  Bowel sounds normal  No rebound tenderness  No gaurding  +mild distention, dsg intact RLQ   EXTR Trace PT edema bilat   Additional Physical Exam Skin: pale, warm, dry   Lab Results: Hepatic:  19-Dec-14 04:07   Bilirubin, Total 0.5  Alkaline Phosphatase 83 (45-117 NOTE: New Reference Range 05/21/13)  SGPT (ALT) 16  SGOT (AST)  56  Total Protein, Serum  5.9  Albumin, Serum  2.3  Routine Chem:  19-Dec-14 04:07   Glucose, Serum  133  BUN  68  Creatinine (comp)  4.58  Sodium, Serum 136  Chloride, Serum 98  CO2, Serum 29  Calcium (Total), Serum 9.2  Osmolality (calc) 294  eGFR (African American)  13  eGFR (Non-African American)  11 (eGFR values <65m/min/1.73 m2 may be an indication of chronic kidney disease (CKD). Calculated eGFR is useful in patients with stable renal function. The eGFR calculation will not be reliable in acutely ill patients when serum creatinine is changing rapidly. It is not useful in  patients on dialysis. The eGFR calculation may not be applicable to patients at the low and high extremes of body sizes, pregnant women, and vegetarians.)  Anion Gap 9  Routine Coag:  19-Dec-14 04:07   Prothrombin  20.1  INR 1.7 (INR reference interval applies to patients on  anticoagulant therapy. A single INR therapeutic range for coumarins is not optimal for all indications; however, the suggested range for most indications is 2.0 - 3.0. Exceptions to the INR Reference Range may include: Prosthetic heart valves, acute myocardial infarction, prevention of myocardial infarction, and combinations of aspirin and anticoagulant. The need for a higher or lower target INR must be assessed individually. Reference: The Pharmacology and Management of the Vitamin K  antagonists: the seventh ACCP Conference on Antithrombotic and Thrombolytic Therapy. CNTIRW.4315Sept:126 (3suppl): [id number] A HCT value >55% may artifactually increase the PT.  In one study,  the increase was an average of 25%. Reference:  "Effect on Routine and Special Coagulation Testing Values of Citrate Anticoagulant Adjustment in Patients with High HCT Values." American Journal of Clinical Pathology 2006;126:400-405.)  Routine Hem:  19-Dec-14 04:07   WBC (CBC) 9.4  RBC (CBC)  3.24  Hemoglobin (CBC)  8.9  Hematocrit (CBC)  28.3  Platelet Count (CBC) 208  MCV 87  MCH 27.7  MCHC  31.7  RDW  21.0  Neutrophil % 81.2  Lymphocyte % 6.4  Monocyte % 10.6  Eosinophil % 0.5  Basophil % 1.3  Neutrophil #  7.6  Lymphocyte #  0.6  Monocyte # 1.0  Eosinophil # 0.1  Basophil # 0.1 (Result(s) reported on 18 Jun 2013 at 05:32AM.)   Assessment/Plan:  Assessment/Plan:  Assessment Melena/Hematemesis: Likely PUD.  On PPI.  Gastric lavage dark coffee ground to  clear, no active fresh blood yesterday.  EGD once stable, off Levophed, if able.  INR still 1.7.   Apendiceal abscess s/p percutaneous drainage: followed by surgery   Plan 1) Follow H/H 2) continue PPI 3) EGD in near future Dr. Vira Agar to cover for [place] over the weekend. Please call if you have any questions or concerns   Electronic Signatures: Andria Meuse (NP)  (Signed 19-Dec-14 10:00)  Authored: Chief Complaint, VITAL SIGNS/ANCILLARY  NOTES, Brief Assessment, Lab Results, Assessment/Plan   Last Updated: 19-Dec-14 10:00 by Andria Meuse (NP)

## 2014-10-21 NOTE — Op Note (Signed)
PATIENT NAME:  Jerry NevinHEATH, Derel R MR#:  [id number] DATE OF BIRTH:  04-08-1933  DATE OF PROCEDURE:  08/27/2012  PREOPERATIVE DIAGNOSES: 1.  End-stage renal disease.  2.  Peritonitis forcing removal of peritoneal dialysis catheter and initiation of hemodialysis.  3.  Hypertension.   POSTOPERATIVE DIAGNOSIS:   1.  End-stage renal disease.  2.  Peritonitis forcing removal of peritoneal dialysis catheter and initiation of hemodialysis.  3.  Hypertension.   PROCEDURES PERFORMED: Right brachiocephalic AV fistula creation.   SURGEON: Annice NeedyJason S. Tamyrah Burbage, M.D.   ANESTHESIA: General.   ESTIMATED BLOOD LOSS: Minimal.   INDICATION FOR PROCEDURE: This is a 81 year old gentleman with end-stage renal disease. He has had to  transition from peritoneal dialysis to hemodialysis. The timing of moving him back to peritoneal dialysis is unknown, but would be at least several months and a fistula creation is indicated to try to get him of catheter-free for hemodialysis. Risks and benefits were discussed. Informed consent was obtained.   DESCRIPTION OF PROCEDURE: The patient is brought to the operative suite and after an adequate level of general anesthesia was obtained, the right upper extremity was sterilely prepped and draped and a sterile surgical field was created. After appropriate surgical timeout and intravenous antibiotics, a curvilinear incision was created at the antecubital fossa. We dissected out a large patent cephalic vein, ligated one lateral branch between silk ties and marked this for orientation. The brachial artery was then identified, dissected out and circled the vessels proximally and distally. The patient was given 3000 units of intravenous heparin for systemic anticoagulation. Control was pulled up on the vessel loops. An anterior wall arteriotomy was created with an 11 blade and extended Potts scissors. The vein was ligated distally, cut and beveled to an appropriate length to match the arteriotomy.  Anastomosis was created with a running 6-0 Prolene suture in the usual fashion. The vessel was flushed  prior to releasing control. On release, there was a nice thrill within the AV fistula and a palpable pulse in the radial artery distally. At this point, Surgicel was placed. The wound was closed with a running 3-0 Vicryl and 4-0 Monocryl. Dermabond was placed as a dressing. The patient tolerated the procedure well and was taken to the recovery room in stable condition.    ____________________________ Annice NeedyJason S. Shareena Nusz, MD jsd:cc D: 08/27/2012 15:00:21 ET T: 08/27/2012 18:12:34 ET JOB#: [id number]  cc: Annice NeedyJason S. Caden Fatica, MD, <Dictator> Munsoor Lizabeth LeydenN. Lateef, MD Annice NeedyJASON S Jennifr Gaeta MD ELECTRONICALLY SIGNED 09/03/2012 9:39

## 2014-10-21 NOTE — H&P (Signed)
PATIENT NAME:  Jerry Allen, Jerry Allen MR#:  [id number] DATE OF BIRTH:  October 19, 1932  DATE OF ADMISSION:  05/31/2013  PRIMARY CARE PHYSICIAN: Dr. Bethann Allen   NEPHROLOGIST:  [facility].   PRIMARY CARDIOLOGIST:  Dr. Darrold Junker.   ADMITTING PHYSICIAN: Dr. Michela Pitcher.   CHIEF COMPLAINT: Abdominal pain.   BRIEF HISTORY: Mr. Jerry Allen is a 81 year old gentleman seen in the Emergency Room with abdominal pain for approximately 4 to 5 days. The patient noted onset of right lower quadrant pain approximately five days ago and this was associated with mild nausea. He has not vomited. He had persistent abdominal pain though, to the point it became unbearable today. He contacted his primary care physician, Dr. Bethann Allen who recommended an office for evaluation. He was sent to the Emergency Room for CT scanning, following that visit, the CT scan demonstrated what appeared to be a right lower quadrant abscess, possibly related to his appendix. The surgical service was consulted.   The patient complains multiple previous episodes of similar pain, not as severe and none that lasted as long period. He never had any intervention with this problem before. He denies any other significant abdominal problems. Specifically denies a history of hepatitis, yellow jaundice, pancreatitis, peptic ulcer disease, gallbladder disease or diverticulitis.   The patient has multiple medical problems including severe coronary artery disease, mitral valve disease following coronary artery bypass and mitral valve surgery, chronic atrial fibrillation, currently on Coumadin, significant peripheral vascular disease, severe venous insufficiency, end-stage renal disease currently on hemodialysis after failed peritoneal dialysis and diabetes mellitus. He was recently admitted to the vascular service for revision of his graft in the right arm. He has had previous peritoneal dialysis with history of peritoneal infection in January 2014, which  resolved without intervention. The patient, however, has not had any further abdominal dialysis. The patient did off Coumadin for several days.   CURRENT MEDICATIONS: Include aspirin 81 mg p.o. daily, Crestor 20 mg p.o. at bedtime, Lantus 30 units subcutaneous q. a.m., Protonix 40 mg once a day, Carafate 1 gram t.i.d., torsemide 20 mg p.o. daily, warfarin 3 mg Monday, Wednesday, Friday; 0.5 mg Tuesday, Thursday, Saturday and Sunday. He is on a Monday, Wednesday, Friday dialysis schedule.   SOCIAL HISTORY:  He is a reformed cigarette smoker lives at home with his wife to assist in his care.   PAST SURGICAL HISTORY:  Previous operations outlined in his chart, but basically include coronary artery bypass, mitral valve repair and total knee replacement. Angiographic intervention with stent placement in both legs, cataract surgery and AV fistula  ligation. He had peritoneal dialysis catheter in place, and then removed six months later.   ALLERGIES:  HE IS ALLERGIC TO HAVE IRON.   PHYSICAL EXAMINATION: GENERAL: He is lying quietly in bed, having had some pain medication and complains of no significant abdominal pain at the present time.  VITAL SIGNS:   His heart rate is 98 and regular. He does appear to have normal blood pressure at 158/86. He is afebrile.  HEENT: Unremarkable. He is a bit sallow complected, but otherwise has no facial deformity, pupil abnormalities or scleral icterus.  NECK: Supple, nontender with a midline trachea and he has a right IJ dialysis catheter.  CHEST: Clear with no adventitious sounds, but he has very poor pulmonary excursion and appears to be mildly short of breath at rest.  CARDIAC: Reveals a 3/6 systolic murmur and a 1/6 diastolic murmur. He does appear to be in normal sinus rhythm  by exam. No gallop rhythms are noted.  ABDOMEN:  Reveals some mild abdominal tenderness but a large mass in the right lower quadrant, right flank area, which is palpable. He does have guarding  with no rebound. He has hyperactive bowel sounds.  EXTREMITIES: Reveals multiple bruises, no deformities and no distal pulses.  PSYCHIATRIC: Reveals some mild depressive affect, but no orientation problems.   DIAGNOSTIC DATA: I have independently reviewed his CT scan. He does appear to have a large fluid collection just off his liver. I would be interested in the CT scan from his peritoneal dialysis admission, as he did have significant peritonitis at that time. I do believe I can see the appendix, so this abscess does appear to be intimately associated with the appendix.   He has very poor surgical candidate. With his elevated pro time and is depressed platelet count, we will have to talk to medicine and intervention radiology about reversing his anticoagulation consider percutaneous drainage. We will have cardiology to see him with regard to his cardiac risk should surgery become as necessary. I will continue his antibiotic treatment have pharmacy help dose this in view of his renal disease. We will also get renal to see him with regarding to arranging his dialysis and helping us with his medications. This plan has been discussed with the patient and his family and they are in agreement.     ____________________________ Carmie Endalph L. Ely III, MD rle:cc D: 05/31/2013 22:58:31 ET T: 05/31/2013 23:22:55 ET JOB#: [id number]  cc: Carmie Endalph L. Ely III, MD, <Dictator> Danella PentonMark F. Miller, MD Marcina MillardAlexander Paraschos, MD Laverda SorensonSarath C. Kolluru, MD Quentin OreALPH L ELY MD ELECTRONICALLY SIGNED 06/01/2013 0:36

## 2014-10-21 NOTE — Consult Note (Signed)
Brief Consult Note: Diagnosis: UGI bleed, appendicitis/abscess/resolving.   Patient was seen by consultant.   Recommend further assessment or treatment.   Comments: pt has appendicitis with abscess treated with drainage and IV abx. CT shows improvement. There are no current recommendations for interval appendectomy as has been the standard in the past. No surgical plans. Available as needed.  Electronic Signatures: Lattie Hawooper, Eleah Lahaie E (MD)  (Signed 18-Dec-14 21:14)  Authored: Brief Consult Note   Last Updated: 18-Dec-14 21:14 by Lattie Hawooper, Khloi Rawl E (MD)

## 2014-10-21 NOTE — Consult Note (Signed)
Brief Consult Note: Diagnosis: Peritonitis.  History of end-stage renal disease.  Peritoneal dialysis nightly.  History of CAD and chronic atrial fibrillation.  Abnormal GI x-ray results with revealing evidence suspicious for early mild inflammatory changes involving the hepatic flexure.   Consult note dictated.   Discussed with Attending MD.   Comments: Patient's presentation discussed with Dr. Lutricia FeilPaul Oh.  Recommend continued treatment and management of peritonitis.  Continue current antibiotic regimen.  Will continue to closely monitor.  If abdominal pain does not seem to improve will consider proceed with diagnostic colonoscopy based on CT scan of abdomen findings of suspicion of inflammatory changes to hepatic flexure.  Electronic Signatures: Rodman KeyHarrison, Aydin Hink S (NP)  (Signed 15-Jan-14 14:34)  Authored: Brief Consult Note   Last Updated: 15-Jan-14 14:34 by Rodman KeyHarrison, Strother Everitt S (NP)

## 2014-10-21 NOTE — Consult Note (Signed)
Brief Consult Note: Diagnosis: Pulm edema, Hypotension, CAD s/p CABG, ESRD on HD, PVD.   Patient was seen by consultant.   Consult note dictated.   Orders entered.   Discussed with Attending MD.   Comments: 81y/o male with PMH of DM, CAD s/p CABG, PVD, ESRD on M-W-F dialysis came for outpatient fistula placement and post procedure was dyspneic, Pulm edema on CXR and Hypotensive remaining on phenylephrine drip  * Post procedure dyspnea- secondary to pulm edema, pt due for dialysis today, received lasix one time dose and had 45cc of urine- pt is oliguric as dialysis pt cont his torsemide nephrology notified- dialysis in am  * Hypotension- has known ischemic cardiomyopathy, last known EF 30% Cont phenylephrine drip and wean as tolerated avoid any antihypertensives  * ESRD on HD- has permacath fistula placed asa and coumadin on hold per vascualr nephro consulted, dialysis likely tomorrow  * Afib - rate controlled now, not on meds due to hypotension follows with Dr. Darrold JunkerParaschos coumadin on hold  * CAD s/p CABG- stable, recent nuclear stress test normal per pt medical mgmt  * GI prophylaxis.  Electronic Signatures: Enid BaasKalisetti, Daquon Greenleaf (MD)  (Signed 15-Aug-14 18:12)  Authored: Brief Consult Note   Last Updated: 15-Aug-14 18:12 by Enid BaasKalisetti, Batina Dougan (MD)

## 2014-10-21 NOTE — Consult Note (Signed)
PATIENT NAME:  Jerry NevinHEATH, Jerry R MR#:  [id number] DATE OF BIRTH:  1933/04/28  DATE OF CONSULTATION:  06/21/2013  REFERRING PHYSICIAN:  Bethann PunchesMark Miller, MD CONSULTING PHYSICIAN:  Jerry MillardAlexander Andy Moye, MD  CHIEF COMPLAINT: Nausea and vomiting.   HISTORY OF PRESENT ILLNESS: The patient is a 81 year old gentleman with known history of coronary artery disease, status post bypass graft surgery, ischemic cardiomyopathy, chronic systolic congestive heart failure, atrial fibrillation and end-stage renal disease on hemodialysis. The [facility] with nausea, vomiting and diarrhea. The patient has had recent right lower quadrant appendiceal abscess, which was drained percutaneously. CT scan was performed, which revealed persistent fluid. The patient was seen by general surgery without recommendations for surgery. The patient was also evaluated by GI for melena and anemia. Hemoglobin has stabilized,  with plan for EEG in the near future. The patient has been hypotensive on Levophed, but mentating appropriately. The patient denies chest pain or worsening shortness of breath.   PAST MEDICAL HISTORY:  1.  Status post non-ST-elevation myocardial infarction on 08/29/2002. 2.  Status post coronary artery bypass graft surgery x 4, 11/03/2002.  3.  Ischemic cardiomyopathy.  4.  Chronic systolic congestive heart failure.  5.  Atrial fibrillation.  6.  Hyperlipidemia.  7.  Diabetes.  8.  End-stage renal disease, on chronic hemodialysis.  9.  History of prior GI bleed.   MEDICATIONS: Aspirin 81 mg daily, torsemide 20 mg daily, Crestor 20 mg daily, warfarin 3 mg q.p.m., multivitamin 1 daily, vitamin D3 1 tablet daily, Carafate 1 gram t.i.d.,. Lantus insulin 30 units at bedtime and pantoprazole 40 mg daily.   SOCIAL HISTORY: The patient is married and resides with his wife. He has a history of smoking, quit 08/2002.   FAMILY HISTORY: The patient has a brother, status post angioplasty and coronary  stent.   REVIEW OF SYSTEMS: CONSTITUTIONAL: No fever or chills. EYES: No blurry vision.  EARS: No hearing loss. RESPIRATORY: Chronic exertional dyspnea. CARDIOVASCULAR: No chest pain. GASTROINTESTINAL: Nausea, vomiting, diarrhea, melena as described above.  GENITOURINARY: No dysuria or hematuria. ENDOCRINE: No polyuria or polydipsia. MUSCULOSKELETAL: No arthralgias or myalgias. NEUROLOGICAL: No focal muscle weakness or numbness. PSYCHOLOGICAL: No depression or anxiety.   PHYSICAL EXAMINATION:  VITAL SIGNS: Blood pressure 84/49, pulse 80, respirations 12, temperature 97.9, pulse oximetry 99%.  HEENT: Pupils equal, reactive to light and accommodation.  NECK: Supple without thyromegaly.  LUNGS: Clear.  HEART: Normal JVP. Diffuse PMI. Regular rate and rhythm. Normal S1, S2. Grade 2/6 systolic murmur.  ABDOMEN: Soft and nontender without hepatosplenomegaly. Pulses were intact bilaterally.  MUSCULOSKELETAL: Normal muscle tone.  NEUROLOGIC: The patient is alert and oriented x 3. Motor and sensory both grossly intact.   IMPRESSION: A 81 year old gentleman, who presents with nausea, vomiting, abdominal pain, recent history of appendiceal abscess, status post drainage with melena and anemia, which is now stabilized. The patient is hypotensive, currently on low-dose Levophed, mentating well without signs or symptoms of fluid overload or congestive heart failure. Most recent echocardiogram revealed mildly reduced left ventricular function with moderate aortic stenosis.   RECOMMENDATIONS:  1.  I agree with overall current therapy.  2.  Continue to taper Levophed off.  3.  Defer chronic anticoagulation at this time.  4.  Review 2D echocardiogram.  5.  Further recommendations pending echocardiogram results.  ____________________________ Jerry MillardAlexander Kal Chait, MD ap:aw D: 06/21/2013 11:59:34 ET T: 06/21/2013 12:09:26 ET JOB#: [id number]  cc: Jerry MillardAlexander Makana Rostad, MD, <Dictator> Jerry MillardALEXANDER Riyansh Gerstner  MD ELECTRONICALLY SIGNED 06/30/2013 8:41

## 2014-10-21 NOTE — Consult Note (Signed)
Brief Consult Note: Diagnosis: melena, vomiting black clots, anemia.   Patient was seen by consultant.   Consult note dictated.   Comments: Mr. Jerry Allen is a pleasant 81 y/o caucasian male with recent apendiceal abscess s/p percutaneous drainage by IR on antibiotics, aspirin & coumadin at home who presented with diarrhea, abdominal pain, nausea, vomiting black "clots" & melena.  Hgb 7.7 upon admission, & only 7.6 after 1 unit PRBCS.  Likely pt has UGI bleed secondary to PUD.  Discussed with Dr Servando SnareWohl.  His INR 1.8 & he is on pressors.  EGD once stable.  We will place NGT & lavage to look for active bleeding.  If bright red blood, EGD to be done more urgently, otherwise we will wait for INR close to 1.5 & pt clinically more stable given increased risk of sedation & bleeding complications.  Agree with transfusions to keep hgb above 8 grams given cardiac issues & recent peritonitis.  Plan: 1) NGT to LCS, lavage with tap water, RN to page me with results 2) Monitor H/H, transfuse PRN  3) Agree with PPI 4) EGD in near future with Dr Servando SnareWohl.  Discussed risks/benefits of procedure which include but are not limited to bleeding, infection, perforation & drug reaction.  Patient agrees with this plan & consent will be obtained.  Thanks for consult.  Please see full dictated note. #[id number].  Electronic Signatures: Joselyn ArrowJones, Drucella Karbowski L (NP)  (Signed 18-Dec-14 12:49)  Authored: Brief Consult Note   Last Updated: 18-Dec-14 12:49 by Joselyn ArrowJones, Jeweldean Drohan L (NP)

## 2014-10-21 NOTE — Op Note (Signed)
PATIENT NAME:  Jerry NevinHEATH, Garold R MR#:  [id number] DATE OF BIRTH:  03/21/1933  DATE OF PROCEDURE:  12/29/2012  PREOPERATIVE DIAGNOSIS: Pseudoaneurysm, left groin.   POSTOPERATIVE DIAGNOSIS: Pseudoaneurysm, left groin.   PROCEDURE PERFORMED: Thrombin injection percutaneously, left pseudoaneurysm.   PROCEDURE PERFORMED BY: Renford DillsGregory G. Schnier, MD  SEDATION: Versed 3 mg plus fentanyl 100 mcg administered IV. Continuous ECG, pulse oximetry and cardiopulmonary monitoring were performed throughout the entire procedure by the interventional radiology nurse. Total sedation time was 30 minutes.   ACCESS: None.   CONTRAST USED: None.   FLUOROSCOPY TIME: None.   INDICATIONS: [name] is a 81 year old gentleman who is maintained on Coumadin secondary to atrial fibrillation. Approximately 2 weeks ago, he underwent thrombin injection of the left groin pseudoaneurysm which occurred after right lower extremity arterial angiography and intervention. He presented back to the office for his followup and was found to have recurrence of his pseudoaneurysm. Risks and benefits for repeat thrombin injection were reviewed. This pathway is selected as the patient has multiple comorbidities and has a high risk for surgery. He is therefore undergoing second attempt at thrombin injection.   DESCRIPTION OF PROCEDURE: The patient is taken to special procedures and placed in the supine position. After adequate sedation is achieved, the left groin is prepped and draped in a sterile fashion. Ultrasound is placed in a sterile sleeve. Ultrasound is utilized to evaluate the pseudoaneurysm. To-and-fro flow was confirmed with a big aneurysmal cavity. There is significant thrombus laminating the wall; however, overall, the aneurysm itself now measures more than 4 cm in its long axis. In an area of to-and-fro flow, the ultrasound is pinned, and 1% lidocaine is infiltrated in the soft tissues, and thrombin on a 21-gauge needle is then  inserted. Placement of the tip of the needle it is verified to be within the sac, and injection of approximately 1 mL of thrombin is performed. There is an immediate flash with a cessation of color flow. The entire sac is now filled with heterogeneous material, and there is no evidence of Doppler signal. This appears to have been successful treatment of the aneurysm, and the patient is taken to the recovery area in excellent condition. Formal duplex ultrasound will be obtained prior to his being discharged from the hospital to insure the aneurysm had been adequately treated.   ____________________________ Renford DillsGregory G. Schnier, MD ggs:OSi D: 12/29/2012 11:09:56 ET T: 12/29/2012 11:44:56 ET JOB#: [id number]  cc: Renford DillsGregory G. Schnier, MD, <Dictator> Danella PentonMark F. Miller, MD Renford DillsGREGORY G SCHNIER MD ELECTRONICALLY SIGNED 01/05/2013 14:30

## 2014-10-21 NOTE — Consult Note (Signed)
Pt seen and examined. See Dawn Harrrison's notes. Has peritonitis, which is likely causing his abd pain. Treat peritonitis 1st. If abd pain persists, then will consider colonoscopy later to evaluate right side of colon. Thanks.  Electronic Signatures: Lutricia Feilh, Jerardo (MD)  (Signed on 15-Jan-14 14:40)  Authored  Last Updated: 15-Jan-14 14:40 by Lutricia Feilh, Kassius (MD)

## 2014-10-21 NOTE — Consult Note (Signed)
PATIENT NAME:  Jerry Allen, PERRIER MR#:  [id number] DATE OF BIRTH:  1932-07-04  DATE OF CONSULTATION:  07/15/2012  PRIMARY CARE  AND ATTENDING PHYSICIAN: Emily Filbert, MD  CONSULTING PHYSICIAN:  Verdie Shire, MD/Aaron Boeh Ruthe Mannan, NP PRIMARY GASTROENTEROLOGIST: Loistine Simas, MD  REASON FOR CONSULTATION: Right lower quadrant abdominal pain.   HISTORY OF PRESENT ILLNESS: The patient is a 81 year old Caucasian gentleman with significant past medical history of end-stage renal disease, performs peritoneal dialysis on a daily basis, hypertension, chronic atrial fibrillation, on chronic anti-coagulant therapy, warfarin. He presented to [facility] Emergency Room on Monday evening around 3:30. He had been in his normal state of health up until this past Sunday. At lunch time, he said he was just  not feeling well, started experiencing dry heaving and then abdominal pain. This consistently persisted throughout persisted up until Monday, at which time he notified Dr. Ammie Ferrier office and was advised to present to the Emergency Room. Upon admission, he was initially placed in the Intensive Care Unit for the concern of yet for the concern of acute renal failure. Creatinine level was 10. He is in a medical/surgical room at this time. He has had decreased voiding over the past 2 to 3 days, also noted a change of bowel habits since being admitted, 2 small bowel movements yesterday, 1 this morning liquid in consistency. No recent antibiotic therapy prior to being admitted. CT scan of abdomen and pelvis was done on admission which revealed evidence of a suspicion of early mild inflammatory changes involving the hepatic flexure, also noted, cardiomegaly and a component of pulmonary edema is raised with a small left effusion, atelectasis. Infrarenal abdominal aortic aneurysm measuring 4.6 x 4.4 cm . Dialysis fluid has been sent off to cytology. Results: Color is white, clarity was cloudy, enucleated  cell count is 14,886, 98% neutrophils and  2% monocytes. CBC on admission: WBC count was 9.2, hemoglobin was 10.6 with hematocrit of 32.2. Currently, PT is 30.3 with an INR of 2.9.   The patient continues to experience generalized abdominal pain, noted to be worse with a deep breath, movement, more localized, though, to the right side of his abdomen and right lower quadrant. The patient says that he has not started to feel better since being admitted. Most recent colonoscopy as well as an upper endoscopy was performed by Dr. Loistine Simas 05/01/2012. Five polyps were resected at that time. Findings of diverticulosis and internal hemorrhoids. Upper endoscopy revealed middle third of the esophagus to lower third of the esophagus being moderately torturous. A Z line was irregular, a small papillary lesion was noted in the upper sphincter 16 cm from the alveolar ridge, which was not biopsied. Patchy moderate inflammation characterized by congestion, erythema and granularity was found in the gastric atrium. Patchy moderate inflammation characterized by congestion was found in the gastric body as well of the cardia in the first part of the duodenum. Mild portal hypertensive gastropathy was found in the gastric body.   HOME MEDICATIONS: Aspirin 81 mg a day, calcitriol 0.25 mcg once a day, calcium acetate 667 mg 3 capsules 3 times a day, Centrum Silver 1 tablet daily. Crestor 20 mg once a day, enalapril 10 mg a day, Lantus 30 units twice a day, metoprolol tartrate 50 mg twice a day, Mucinex 600 mg once a day, Protonix 40 mg once a day, sucralfate 1 gram 3 times daily, torsemide 20 mg twice daily, and vitamin D 1000 units once a day, and warfarin 3 mg every other day  with 1.5 mg every other day.   ALLERGIES: IRON CAUSES NAUSEA, VOMITING, AND GI DISTRESS.   PAST MEDICAL HISTORY: End-stage renal disease requiring peritoneal dialysis, severe systemic hypertension, chronic atrial fibrillation with chronic anticoagulation therapy, on warfarin, followed by Dr.  Isaias Cowman, anemia of chronic disease, history of coronary artery disease, status post myocardial infarction, history of valve repair at [facility] 10 years ago, sustained CABG, quadruple bypass at that time, diabetes mellitus, type 2 insulin-dependent and hypercholesterolemia.   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   SOCIAL HISTORY: Married, retired. Remote tobacco use, quit in 2004. No alcohol.   FAMILY HISTORY: Sister with history of breast cancer, brother liver cancer. Mother deceased from complications of dementia. Father deceased, congestive heart failure at the age of 20.   REVIEW OF SYSTEMS: CONSTITUTIONAL: Denies any fevers. Significant for chills and fatigue.  EYES: No blurred vision, double vision, though left pupil is smaller around, which is his good eye. Right eye, pupils irregular and diminished vision due to multiple surgeries.  EARS, NOSE AND THROAT: No hearing impairment, sore throat or dysphagia.  CARDIOVASCULAR: No chest pain, mild dyspnea. No edema. No syncopal episodes.  RESPIRATORY: Significant for shortness of breath. No coughing, hemoptysis.  GASTROINTESTINAL: See history of present illness.   GENITOURINARY: Denies dysuria but hesitancy with voiding, Significant for peritoneal significant for peritoneal dialysis.  MUSCULOSKELETAL: Denies arthralgias or myalgias.  INTEGUMENTARY: No rashes. No lesions.  NEUROLOGICAL: No history of CVA, transient ischemic attack or seizure disorders.  PSYCH: No depression. No anxiety.  ENDOCRINE: No heat or cold intolerance.  HEMATOLOGIC: Denies significant easy bruising and bleeding.   PHYSICAL EXAMINATION: VITAL SIGNS: Temperature is 98.8, pulse is 91,  respirations are 18, blood pressure is 115/69, and pulse oximetry is 96%.  GENERAL: Well developed, well nourished 81 year old Caucasian gentleman who appears mildly ill, resting comfortably, though, in bed. No distress, wife at bedside.  HEENT: Normocephalic, atraumatic. Pupils are equal, reactive to light. Conjunctivae are clear. Sclerae are anicteric.  NECK: Supple. Trachea midline. No lymphadenopathy or thyromegaly.  PULMONARY: Symmetric rise and fall of chest. Clear to auscultation anteriorly.  CARDIOVASCULAR: Irregular, S1, S2. No murmurs, no gallops.  ABDOMEN: Slightly distended. Hypoactive bowel sounds. Marked discomfort throughout entire abdomen but more localized to right lower quadrant, right mid abdomen. Peritoneal catheter present to left lower quadrant, dressing dry and intact. No bruits. No masses. The patient was unable to tolerate deep palpation.  RECTAL: Deferred.  MUSCULOSKELETAL: No contractures. No clubbing. Extremity +1 edema.  NEUROLOGICAL: No gross neurological deficits.  PSYCHIATRIC: Alert and oriented x 4. Memory grossly intact. Appropriate affect and mood. SKIN: Color slightly pale, slightly diaphoretic. No rashes. No lesions.   LABORATORY, DIAGNOSTIC AND RADIOLOGICAL DATA:  Chemistry panel on admission January 13th:  Glucose was 199, BUN was 103, creatinine was 10.36, chloride was 94, eGFR 4, B-type natriuretic peptide was 32,467. Hepatic panel: Albumin was 2.8, AST was 12, otherwise within normal limits. Troponin, first in series 0.15, second 0.16, and third is 0.17. CBC:  Hemoglobin 10.6 with an hematocrit of 32.2. WBC 9.2 as previously stated.  PT on admission 32.7 with an INR of 2.1, today 30.3 with an INR of 2.9. Influenza A and B antigens were negative. Blood culture x 2: No growth in  36 hours. Body fluid culture: Organism 1 revealed moderate growth of gram-negative rod, organism 2 moderate growth of gram-negative rod, organism 3 is light growth of gram-negative rod,  many WBCs, Gram stain 2 revealed no organisms were seen.  EKG: Atrial fibrillation, irregular S1, S2.  CT scan of abdomen and pelvis with contrast is noted under history.  Chest x-ray, PA and lateral, January 13th revealed improved interstitial infiltrate, likely representing pulmonary edema, atelectasis versus infiltrate as well as a small effusion in the left lung base and right costophrenic angle region.   IMPRESSION: 1. Peritonitis and a known history of end-stage renal disease, peritoneal dialysis nightly.  2. Known history of coronary artery disease as well as atrial fibrillation.  3. Abnormal GI x-ray, i.e. CT scan of abdomen revealing suspicion for early mild inflammatory changes involving hepatic flexure.   PLAN: The patient's presentation was discussed with Dr. Verdie Shire. Recommendation is for continued treatment at this time for peritonitis. Continue current antibiotic therapy as ordered. We will closely monitor the patient. If his abdominal pain does not seem to improve, based on findings of CT scan of abdomen and pelvis, we will consider proceeding forward with diagnostic colonoscopy based on findings in the hepatic flexure.   These services provided by Payton Emerald, MS, APRN, [facility], FNP,  under collaborative agreement with Verdie Shire, MD.      ____________________________ Payton Emerald, NP dsh:cb D: 07/15/2012 14:31:40 ET T: 07/15/2012 15:28:19 ET JOB#: [id number]  cc: Payton Emerald, NP, <Dictator> Payton Emerald MD ELECTRONICALLY SIGNED 07/16/2012 17:24

## 2014-10-21 NOTE — Consult Note (Signed)
Pt with hx of melena, hgb today 9.3, chest clear in ant fields, heart 3/6 systolic murmur,  Abd obese, bowel sounds present.  Still on Levophed, on dialysis MWF,  PT 18, plt 233, drain in place.  No new suggestions.  Monitor hgb, wean off levophed if possible.  Will need EGD when off Levophed.  Electronic Signatures: Scot JunElliott, Robert T (MD)  (Signed on 20-Dec-14 12:09)  Authored  Last Updated: 20-Dec-14 12:09 by Scot JunElliott, Robert T (MD)

## 2014-10-21 NOTE — Op Note (Signed)
PATIENT NAME:  Jerry Allen, Jerry Allen MR#:  [id number] DATE OF BIRTH:  [birth year]  DATE OF PROCEDURE:  02/09/2013  PREOPERATIVE DIAGNOSIS:  Left groin pseudoaneurysm recurrent.  POSTOPERATIVE DIAGNOSIS:  Left groin pseudoaneurysm recurrent.  PROCEDURE PERFORMED:  Thrombin injection, left groin pseudoaneurysm.   SURGEON: Levora DredgeGregory Schnier.  SEDATION:  Versed 1 mg plus fentanyl 50 mcg administered IV. Continuous ECG, pulse oximetry and cardiopulmonary monitoring is performed throughout the entire procedure by the interventional radiology nurse.  TOTAL SEDATION TIME:  Approximately 30 minutes.   ACCESS:  Micropuncture needle.   CONTRAST USED:  None.   FLUOROSCOPY TIME:  None.   INDICATIONS FOR PROCEDURE:  [name] is a 81 year old gentleman who is rather debilitated and was found to have recurrence of his pseudoaneurysm in the office. Interestingly, the recurrence is now significantly smaller in size than the original. He is therefore undergoing a final attempt at thrombin injection before operative surgery would be required. The risks and benefits are reviewed. All questions answered and the patient, as well as his wife, have agreed to proceed.   DESCRIPTION OF PROCEDURE:  The patient is taken to the special procedure suite, placed in the supine position. After adequate sedation is achieved, his left groin is prepped and draped in sterile fashion. Ultrasound is then placed in a sterile sleeve and is used to interrogate the aneurysm. On Wallace CullensGray scale, the aneurysm has a slurry appearance with very sluggish flow. With color flow, there is still to-and-fro flow noted. Under direct ultrasound visualization, a micropuncture needle is inserted into the center of the pseudoaneurysm sac and a small amount of heparin approximately 0.75 mL is then introduced. There is the characteristic flash and then re-evaluation demonstrates no longer any flow and a much more heterogeneous appearance to the pseudoaneurysm. After  continued observation for several minutes and interrogating the artery and vein to ensure patency is present, I returned to scan the aneurysm and found very much down toward the neck, a small, little area persistent with direct ultrasound visualization. The microneedle was introduced down closer to the neck of the pseudoaneurysm and a small amount of thrombin was introduced approximately 0.5 mL.  Again, a small flash was noted and follow-up interrogation in the aneurysm sac demonstrated there was no flow anywhere throughout the entire sac. Again, interrogation of the artery demonstrated characteristic triphasic flow.   A sterile dressing was applied. The patient tolerated the procedure well and there were no immediate complications.   INTERPRETATION:  Initial views demonstrate there is some persistent flow, although it appears to be much more sluggish at this point and it is smaller in volume than the previous study.  Following an initial injection and then a secondary injection down very much at the level of the neck of the pseudoaneurysm, there appears to be complete resolution. No further flow is noted.  SUMMARY:  Successful thrombin injection of the left groin pseudoaneurysm.    ____________________________ Renford DillsGregory G. Schnier, MD ggs:ce D: 02/10/2013 14:36:36 ET T: 02/10/2013 17:05:43 ET JOB#: [id number]  cc: Renford DillsGregory G. Schnier, MD, <Dictator> Renford DillsGREGORY G SCHNIER MD ELECTRONICALLY SIGNED 02/17/2013 7:26

## 2014-10-21 NOTE — Consult Note (Signed)
PATIENT NAME:  Jerry NevinHEATH, Ichiro R MR#:  [id number] DATE OF BIRTH:  July 20, 1932  DATE OF CONSULTATION:  07/22/2012  REFERRING PHYSICIAN:  Bethann PunchesMark Miller, MD CONSULTING PHYSICIAN:  Stann Mainlandavid P. Sampson GoonFitzgerald, MD  REASON FOR CONSULTATION: Antibiotic management of peritoneal dialysis catheter-associated peritonitis.   HISTORY OF PRESENT ILLNESS: This is a very pleasant 81 year old gentleman with a history of end-stage renal disease who has been on peritoneal dialysis at home and was doing relatively well until he developed onset of abdominal pain. He also had nausea and vomiting. He was admitted on January 13th for further evaluation. At that time, he was found to have a normal white blood count, but analysis of his peritoneal fluid revealed 14,000 white cells and the majority of them neutrophils. He was admitted and started on IV antibiotics.   Cultures of the peritoneal fluid have since grown multiple pathogens and the decision was made to remove the peritoneal dialysis catheter due to ongoing pain and increasing peritoneal fluid count. We are consulted for further antibiotic recommendations.   Since the catheter was removed yesterday the patient said his abdominal pain has almost ceased. He still has some difficulty with his bowels and is only passing small amounts of stool and gas, but he says he feels relatively better. He has had no recent fevers or chills.   PAST MEDICAL HISTORY: 1. End-stage renal disease, on peritoneal dialysis.  2. Known congestive heart failure. 3. Atrial fibrillation.  4. Cardiac valve repair.  5. Coronary artery disease, status post CABG.  6. Diabetes.  7. Hyperlipidemia.  8. Hypertension.  9. Obesity.  10. Anemia of chronic disease.  11. Secondary hyperparathyroidism.    PAST SURGICAL HISTORY:  1. Placement of peritoneal dialysis catheter.  2. CABG x 4 and mitral valve repair in 2004.  3. Left total knee replacement.   SOCIAL HISTORY: The patient is married, lives with his  wife. He has one child and two grandkids. 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 He stopped smoking in 2004 after a 50 pack-year history.   FAMILY HISTORY: Positive for father who died of CHF at age 79 and a sister who died of breast cancer.   REVIEW OF SYSTEMS: Eleven systems reviewed and negative except, as per HPI.  ALLERGIES: The patient is intolerant of iron which causes nausea.  CURRENT MEDICATIONS: The patient is currently on antibiotics including ciprofloxacin 250 mg q. 8 hours and meropenem 1 gram q. 24 hours. He also remains on bisacodyl, calcium acetate, vitamin D3, Carafate, vitamin B, aspirin, sliding scale insulin, Epogen and p.r.n.'s including Tylenol, morphine, Zofran and Restoril.  PHYSICAL EXAMINATION:  VITALS: T-max over the last 24 hours 98.1, pulse 83, blood pressure 123/57 and pulse oximetry 100% on 1 liter.   GENERAL: He is obese, lying comfortably in bed.   HEENT: Pupils are equal, round and reactive to light and accommodation. Extraocular movements are intact. Sclerae anicteric. Oropharynx is clear. His mucous membranes are moist.   NECK: Supple. No anterior cervical, posterior cervical or supraclavicular lymphadenopathy.   HEART: Regular with a II to VI systolic murmur.   LUNGS: Clear.   ABDOMEN: Distended and tympanic but soft and nontender. He has a peritoneal dialysis site in his left upper quadrant that is about 1 cm in diameter and has some mild liquid around it, but no obvious drainage. He has an incision site in his midline lower quadrant that appears intact and clean and dry with no drainage.   EXTREMITIES: 1+ edema bilaterally.  NEUROLOGIC: He is  alert and oriented x 3, grossly nonfocal neuro exam.   DATA: Micro data reveals initial peritoneal dialysis fluid from January 14th growing several organisms including 1) Citrobacter freundii, 2) Pseudomonas aeruginosa, 3) Klebsiella pneumonia. All of the organisms are sensitive to ciprofloxacin. The Citrobacter  is resistant to ceftazidime, but sensitive to gentamicin. The Pseudomonas and the Klebsiella are both sensitive to ceftazidime. There is also rare Enterococcus faecalis. Follow-up culture January 16th grew moderate Pseudomonas and rare Escherichia coli, again sensitive to ciprofloxacin and ceftazidime. Blood cultures from January 13th are negative x 2.  IMAGING: CT scan on January 9th, of the abdomen, revealed findings suspicious for early mild inflammatory changes involving the hepatic flexure. There was also cardiomegaly, component of pulmonary edema, small left effusion, atelectasis versus infiltrate in the left lung base and infrarenal abdominal aortic aneurysm. There was no evidence of bowel obstruction. There was no evidence of drainable loculated fluid collection to suggest an abscess.   White blood count currently is 9.4, hemoglobin 8.8 and platelets 166.   IMPRESSION:  A pleasant 81 year old gentleman with multiple medical problems admitted with peritoneal dialysis catheter site infection. The cultures grew multiple organisms. There is no real evidence of an exit site infection. However, since he grew Pseudomonas, which can be quite difficult to treat and had not initially responded to the first few days of antibiotics, peritoneal dialysis catheter was removed yesterday. I think this was a very good idea and it will be relatively difficult to treat this infection.   RECOMMENDATIONS: 1. I would continue the meropenem and the oral ciprofloxacin until he starts hemodialysis.  2. Once he starts hemodialysis we can stop the meropenem and place him on ceftazidime which can be given 2 grams IV after hemodialysis sessions. Continue the oral Cipro as well. Would recommend at least a 21-day antibiotic course targeted mainly at the Pseudomonas.  3. After the 21-day course would recommend further evaluation of his abdominal exam, possibly CT scan, and possible sampling of any fluid if noted on a CAT scan.    4. I am reassured that his blood cultures are negative. I think it would be safe to place a hemodialysis catheter long-term in him.  5. I would be glad to follow with you. Thank you for the consult. ____________________________ Stann Mainland. Sampson Goon, MD dpf:sb D: 07/22/2012 13:00:00 ET T: 07/22/2012 13:34:36 ET JOB#: [id number]  cc: Onalee Hua P. Sampson Goon, MD, <Dictator> Issiah Huffaker Sampson Goon MD ELECTRONICALLY SIGNED 07/29/2012 15:39

## 2014-10-22 NOTE — Consult Note (Signed)
   Comments   I met with pt's wife. Reviewed pt's hospitalizations and current condition in detail. Wife seems to understand all of pt's medical problems but she still feels that pt can survive this illness and ultimately get back home. She cites Enos Fling in the Bible who had a miraculous recovery from illness after praying to God and lived 45 more yrs. Wife says that if she thought pt were suffering, she would not continue but she does not feel that he is suffering.  talke with wife about code status, in particular CPR/defibrillation in the event of cardiac arrest. She says that she cannot make a decision right now but doubts that she would want this done to pt.  expressed appreciation for meeting and was receptive to our conversation. I will continue to follow pt and meet with wife to assist in decision-making if possible.   Electronic Signatures: Nakeyia Menden, Izora Gala (MD)  (Signed 16-Jan-15 15:37)  Authored: Palliative Care   Last Updated: 16-Jan-15 15:37 by Tahari Clabaugh, Izora Gala (MD)

## 2014-10-22 NOTE — Discharge Summary (Signed)
PATIENT NAME:  Jerry NevinHEATH, Champion R MR#:  [id number] DATE OF BIRTH:  05-Feb-1933  ADDENDUM  DATE OF ADMISSION:  06/18/2013 DATE OF DISCHARGE:  07/06/2013  The patient is with chronic atrial fibrillation, had been on Coumadin. The Coumadin discontinued with his upper gastrointestinal bleed. The family is aware of that and the risk of thromboembolism; however, the risk of anticoagulation at this point is greater than the risk of arteriovenous clotting. Atrial fibrillation rate has been controlled.     ____________________________ Danella PentonMark F. Miller, MD mfm:dp D: 07/06/2013 07:29:00 ET T: 07/06/2013 07:44:29 ET JOB#: [id number]  cc: Danella PentonMark F. Miller, MD, <Dictator> Danella PentonMARK F MILLER MD ELECTRONICALLY SIGNED 07/07/2013 8:04

## 2014-10-22 NOTE — Consult Note (Signed)
PATIENT NAME:  Jerry Jerry Allen, Jerry Jerry Allen#:  [id number] DATE OF BIRTH:  1933/02/19  CARDIOLOGY CONSULTATION  DATE OF CONSULTATION:  06/01/2013  REFERRING PHYSICIAN:  Dr. Michela PitcherEly CONSULTING PHYSICIAN:  Corie Allis D. Juliann Paresallwood, MD  PRIMARY CARE PHYSICIAN:  Dr. Hyacinth MeekerMiller  CARDIOLOGIST:  Dr. Darrold JunkerParaschos  INDICATION:  Preop for surgery with known coronary disease and cardiomyopathy.   HISTORY OF PRESENT ILLNESS:  Jerry [name] Jerry Jerry Allen is a 81 year old white male who presented to the Emergency Room with abdominal pain, right lower quadrant for about 5 days on and off, underwent CT scan which suggested possible ruptured appendix. The patient is potentially scheduled for laparoscopic appendix procedure versus needle drainage. Unfortunately, the patient has afib and is on Coumadin and is preop for the procedure and will need to have his Coumadin level brought down before he is able to have the procedure. The patient has significant known coronary artery disease including coronary artery bypass surgery, valvular repair, peripheral vascular disease with angiographic intervention. He has end-stage renal disease with shunt placement. Again, now he is on Coumadin preop for procedure. Denies any recent chest pain. Complains of weakness and fatigue but mostly abdominal discomfort over the last 5 to 6 days.   SOCIAL HISTORY: Quit smoking. Lives at home with his wife. Retired. Denies significant alcohol consumption.   PAST MEDICAL HISTORY: Coronary artery disease, peripheral vascular disease, end-stage renal disease, obesity, hyperlipidemia, atrial fibrillation, DJD, GERD, hyperlipidemia, diabetes.   FAMILY HISTORY:  Hypertension, cardiomyopathy, history of GI bleed. Positive for coronary artery disease, PCI and stent.  PAST SURGICAL HISTORY:  Includes coronary artery bypass surgery in 2004, knee surgery, shunt placement, peripheral vascular disease, stent placement, mitral valve repair.    REVIEW OF SYSTEMS:  No blackouts or syncope. He has had  nausea but no vomiting. No fever, chills or sweats. No weight loss. No weight gain, hemoptysis, hematemesis. Denies bright red blood per rectum. No vision change or hearing change. No sputum production or cough, just chronic abdominal, colicky, cramping pain that did not improve.   ALLERGIES: None.   MEDICATIONS:  Reportedly he was on aspirin 81 mg a day, Crestor 20 a day. Lantus 30 units subcutaneous in the morning, Protonix 40 a day, Carafate 1 gram 3 times a day, torsemide 20 mg a day, Coumadin  mg Monday, Wednesday, Friday; 0.5 mg Tuesday, Thursday, Saturday Sunday. For dialysis he is on a Monday, Wednesday, Friday schedule.    PHYSICAL EXAMINATION: VITAL SIGNS:  Blood pressure was 160/90, pulse of 90, respiratory rate 16, afebrile.  HEENT:  Normocephalic, atraumatic. Pupils equal and reactive to light.  NECK: Supple. No JVD, bruits or adenopathy.  LUNGS:   clear to auscultation and percussion. No significant wheeze, rhonchi or rales. HEART:  Irregularly irregular.  Systolic ejection murmur at the apex. Positive S3. ABDOMEN: Mild diffuse soreness. Positive bowel sounds. No rebound, guarding. Mild tenderness in the right lower quadrant.  EXTREMITIES:  Wrapped because of peripheral edema, cellulitis. NEUROLOGIC:  Grossly intact.  SKIN:  Normal. Had a shunt in his chest.   LABORATORY, DIAGNOSTIC AND RADIOLOGICAL DATA: CT of the abdomen suggested fluid collection and possible ruptured appendix. White count was 8. Other chemistries: Glucose was low at 62, BUN of 65, creatinine of 5. Sodium 133, potassium 3.9, chloride 96, CO2 of 27. LFTs are negative. Lipase was 252, H and H of 10.7 and 33. White count 8.8, platelet count of 242.   ASSESSMENT:  Preoperative for abdominal surgery and/or percutaneous intervention by invasive radiology, renal failure end-stage, diabetes,  hypertension, obesity, coronary artery disease, cardiomyopathy, hyperlipidemia, anemia, atrial fibrillation, coagulopathy secondary  to Coumadin.   PLAN:  Agree with discontinuing Coumadin. We will control his anticoagulation with heparin for now which can be stopped prior to the procedure but also echocardiogram may be helpful. Continue Zosyn antibiotics preop for surgery. Continue diabetes medication for his long-standing history of diabetes. Continue blood pressure control. Continue Protonix for reflux. Continue Crestor for hyperlipidemia. Will probably hold aspirin prior to the procedure as well. Would consider this patient to be a moderate procedure for percutaneous drainage and a moderate to high surgical risk for laparoscopic or open cholecystectomy because of his comorbid disease.  At any rate, something has to be done to help this patient if he has a significant appendix rupture and/or leaking and once his Coumadin level is in acceptable range, would recommend proceeding with procedure at this point. We will continue to follow the patient. Do not recommend any cardiac interventions except discontinuing Coumadin for now and continue to control rate either with a beta blocker or calcium blocker. Will continue to follow you until the patient treated and recovered.   ____________________________ Bobbie Stack Juliann Pares, MD ddc:ce D: 06/01/2013 16:47:14 ET T: 06/01/2013 18:42:26 ET JOB#: [id number]  cc: Dene Nazir D. Juliann Pares, MD, <Dictator> Alwyn Pea MD ELECTRONICALLY SIGNED 07/12/2013 11:55

## 2014-10-22 NOTE — Consult Note (Signed)
CHIEF COMPLAINT and HISTORY:  Subjective/Chief Complaint Dialysis access   History of Present Illness 81 year old white male with ESRD well known to our practice for hemodialysis access. He had a right jugular perm cath being used for dialysis which started having issues. Per nursing TPA has been tried 3 times recently to reopen it, however has been unsuccessful. He will need a catheter to get going on CRRT today per nephrology.  He recently had a long hospital stay 06/16/13 to 07/06/13. Returned a few days later 07/09/13 with continued issues with refractory hypotension and shortness of breath.   PAST MEDICAL/SURGICAL HISTORY:  Past Medical History:   peritional catheter:    Lower leg swelling:    Peripheral Vascular Disease:    Atrial Fibrillation:    Coronary Artery Disease:    Renal Failure:    Anemia:    Previous blood transfusion:    O2 @ 2L PRN:    Shortness of breath:    Hard of Hearing:    afib:    Arthritis:    Diabetes Mellitus,Type I (IDD):    HTN:    CHF:    MI - Myocardial Infarct:    Right AV Fistula Ligation: Apr 2014   Total knee replacement:    CABG (Coronary Artery Bypass Graft):    Valve Repair:   ALLERGIES:  Allergies:  Iron: N/V, GI Distress  HOME MEDICATIONS:  Home Medications: Medication Instructions Status  budesonide 0.5 mg/2 mL inhalation suspension 1 vial (2 milliliters) via nebulizer 2 times a day Active  fexofenadine 180 mg oral tablet 1 tab(s) orally once a day Active  ondansetron 4 mg oral tablet 1 tab(s) orally every 8 hours, As Needed - for Nausea, Vomiting Active  albuterol-ipratropium 2.5 mg-0.5 mg/3 mL inhalation solution 1 vial(s) via nebulizer 3 times a day Active  pantoprazole 40 mg oral delayed release tablet 1 tab(s) orally once a day Active  acetaminophen-codeine 300 mg-30 mg oral tablet 1 tab(s) orally every 6 hours, As Needed -pain Active  Centrum Special Heart 1 tab(s) orally once a day (in the morning) Active   Vitamin D3 1000 intl units oral tablet 1 tab(s) orally once a day at noon Active  Lantus Solostar Pen 100 units/mL subcutaneous solution 10 unit(s) subcutaneous once a day Active  calcium acetate 667 mg oral capsule 1 cap(s) orally 3 times a day Active  sucralfate 1 g oral tablet 1 tab(s) orally 3 times a day (with meals) Active  torsemide 20 mg oral tablet 1 tab(s) orally once a day Active  Crestor 20 mg oral tablet 1 tab(s) orally once a day (at bedtime) Active  warfarin 3 mg oral tablet 1 tab(s) orally Monday, Wednesday, and Friday and 0.5 tab (1.69m) orally once a day on Tuesday, Thursday, and Saturday. Active   Family and Social History:  Family History Hypertension  Diabetes Mellitus   Social History negative tobacco, negative ETOH, negative Illicit drugs   Review of Systems:  ROS Pt not able to provide ROS   Physical Exam:  GEN critically ill appearing   NECK supple  No masses   RESP appears slightly short of breath   CARD regular rate   VASCULAR ACCESS Right jugular permcath   ABD denies tenderness  soft   EXTR positive edema   SKIN normal to palpation   LABS:  Laboratory Results: Routine Chem:    17-Jan-15 262:94 Basic Metabolic Panel (w/Total Calcium)  Glucose, Serum 186  BUN 13  Creatinine (comp) 1.08  Sodium, Serum 141  Potassium, Serum 4.4  Chloride, Serum 106  CO2, Serum 31  Calcium (Total), Serum 8.9  Anion Gap 4  Osmolality (calc) 286  eGFR (African American) >60  eGFR (Non-African American) >60  eGFR values <5m/min/1.73 m2 may be an indication of chronic  kidney disease (CKD).  Calculated eGFR is useful in patients with stable renal function.  The eGFR calculation will not be reliable in acutely ill patients  when serum creatinine is changing rapidly. It is not useful in   patients on dialysis. The eGFR calculation may not be applicable  to patients at the low and high extremes of body sizes, pregnant  women, and vegetarians.  Result  Comment   LABS - This specimen was collected through an    - indwelling catheter or arterial line.   - A minimum of 5100m of blood was wasted prior     - to collecting the sample.  Interpret   - results with caution.   Result(s) reported on 17 Jul 2013 at 12:08AM.    17-Jan-15 2312:24Basic Metabolic Panel (w/Total Calcium)  Glucose, Serum 190  BUN 19  Creatinine (comp) 1.78  Sodium, Serum 141  Potassium, Serum 5.0  Chloride, Serum 106  CO2, Serum 30  Calcium (Total), Serum 9.0  Anion Gap 5  Osmolality (calc) 289  eGFR (African American) 41  eGFR (Non-African American) 36  eGFR values <6090min/1.73 m2 may be an indication of chronic  kidney disease (CKD).  Calculated eGFR is useful in patients with stable renal function.  The eGFR calculation will not be reliable in acutely ill patients  when serum creatinine is changing rapidly. It is not useful in   patients on dialysis. The eGFR calculation may not be applicable  to patients at the low and high extremes of body sizes, pregnant  women, and vegetarians.  Result Comment   LABS - This specimen was collected through an    - indwelling catheter or arterial line.   - A minimum of 5ml71mf blood was wasted prior     - to collecting the sample.  Interpret   - results with caution.   Result(s) reported on 18 Jul 2013 at 12:40AM.   ASSESSMENT AND PLAN:  Plan 81 y31r old white male with ESRD well known to our practice for hemodialysis access. He had a right jugular perm cath being used for dialysis which started having clotting issues. Per nursing TPA has been tried 3 times recently to reopen it, however has been unsuccessful. He will need a catheter to get going on CRRT today per nephrology.  Right femoral temp cath will be placed today.   Electronic Signatures: HannSu Grand-C)  (Signed 18-Jan-15 15:26)  Authored: Chief Complaint and History, PAST MEDICAL/SURGICAL HISTORY, ALLERGIES, HOME MEDICATIONS, Family and Social  History, Review of Systems, Physical Exam, LABS, Assessment and Plan   Last Updated: 18-Jan-15 15:26 by HannSu Grand-C)

## 2014-10-22 NOTE — H&P (Signed)
PATIENT NAME:  Jerry Allen, Jerry Allen MR#:  [id number] DATE OF BIRTH:  Nov 06, 1932  DATE OF ADMISSION:  07/09/2013  PRIMARY CARE PROVIDER: Dr. Bethann Punches.  EMERGENCY DEPARTMENT REFERRING PHYSICIAN: Dr. Silverio Lay.   CHIEF COMPLAINT: Hypotension,   HISTORY OF PRESENT ILLNESS: The patient is a 81 year old Caucasian male who actually had a very prolonged hospitalization course. He was admitted on 12/17 and discharged on 07/06/2013. At that time, he had a refractory hypotension requiring pressor agents. He also had acute upper GI bleeding. I am not really seeing if there was any endoscopy done. The discharge summary is very unclear about the hospital course. The patient also has end-stage renal disease and apparently has a diagnosis of chronic appendicitis according to his discharge summary and also has severe aortic stenosis. The patient was discharged 2 days ago to [facility] and is brought back. After getting dialyzed for 3 hours, he started becoming hypotensive and also started having shortness of breath. The patient will have to be started on Levophed here. Blood pressure is  improved, but he was also short of breath so he had to be placed on a full face mask. When I try to ask him any questions, he states that why do I need to answer all these again and again and refuses to answer any question (his wife is at the bedside), so review of systems is currently limited.   PAST MEDICAL HISTORY: History of recent appendiceal abscess with percutaneous drainage, severe coronary artery disease, end-stage renal disease, history of A. fib (was on Coumadin, that has been taken off since his recent hospitalization), peripheral vascular disease, venous insufficiency, severe aortic stenosis, apparently chronic appendicitis, insulin-requiring diabetes, history of low-grade CHF from volume excess.   PAST SURGICAL HISTORY: Status post CABG, mitral valve repair, total knee replacement, percutaneous stent intervention in the leg for  peripheral arterial disease, cataract surgery, peritoneal dialysis catheter which since has been removed.  ALLERGIES: IRON.  SOCIAL HISTORY: History of smoking, none now. No alcohol. He is currently at the skilled nursing facility, has just been there 2 days.   FAMILY HISTORY: Hypertension.   DISCHARGE MEDICATIONS: Per the discharge MD's instructions: Vitamin D3 1000 international units daily, Centrum Silver daily, calcium 1 tab p.o. daily, Tylenol No. 3 q.6 p.r.n. for pain, Lantus SoloSTAR insulin 10 units at bedtime, DuoNebs t.i.d., guaifenesin 600 b.i.d., midodrine 10 t.i.d., Protonix 40 b.i.d., Zofran 4 mg q.4 p.r.n.   REVIEW OF SYSTEMS: Limited, as the patient does not want to give me any answers.  CONSTITUTIONAL: Denies fevers. Complains of fatigue, weakness. No pain.  RESPIRATORY: Denies any cough or wheezing. He is short of breath.  CARDIOVASCULAR: Denies any chest pain.   PHYSICAL EXAMINATION: VITAL SIGNS: Temperature 97.4, pulse 88, respirations 22, blood pressure 81/51.  GENERAL: The patient is a critically ill-appearing male.  HEENT: Head atraumatic, normocephalic. Pupils equally round, reactive to light and accommodation. There is no conjunctival pallor. No scleral icterus. Nasal exam shows no drainage or ulceration. Oropharynx is clear without any exudate.  NECK: Supple without any JVD.  CARDIOVASCULAR: Irregularly irregular rhythm. There is a systolic murmur appreciated at the sternal border.  ABDOMEN: Mild distention. Positive bowel sounds x4. There is no guarding, no rebound. EXTREMITIES: No clubbing, cyanosis or edema.  NEUROLOGICAL: Cranial nerves II through XII grossly intact. No focal deficits.  PSYCHIATRIC: Not anxious or depressed.  LYMPH NODES: None appreciated.  VASCULAR: Good DP, PT pulses.   LABORATORY AND RADIOLOGICAL DATA: In the ED: Chest x-ray which  shows no evidence of pneumothorax, component of mild CHF. INR 1.5. Lactic acid 1.4. BNP is 112,315. CPK 34,  CK-MB 3.1. Glucose 133, BUN 30, creatinine 2.81, sodium 137, potassium 4.2, chloride 99; CO2 is 32; calcium is 9.0. WBC 8.0, hemoglobin 10, platelet count 174.   ASSESSMENT AND PLAN: The patient is a 81 year old white male who had a prolonged hospitalization recently for hypotension, upper gastrointestinal bleed, comes back with hypotension.  1.  Hypotension: I feel that this is likely a combination of multiple factors ongoing, including diagnosis of apparently severe aortic stenosis. I will also check a random cortisol level. The patient is already on midodrine. Consider starting him on Florinef if okay with nephrology.  2.  Acute respiratory failure: Likely related to fluid overload from dialysis. The patient had his dialysis 3 hours. They were unable to remove further fluid due to hypotension. Will ask  nephrology to see the patient.  3.  End-stage renal disease: As above.  4.  Likely volume overload from congestive heart failure: Hemodialysis if tolerates per nephrology.  5.  Elevated troponin: Likely due to demand ischemia. Will ask cardiology to see again, but likely there is no further treatment that could be done.  6.  Diabetes, insulin requiring: I will place him on sliding scale since he will be on a clear liquid diet for time being.  7.  History of gastrointestinal bleed: I am going to place him on Protonix p.o. b.i.d.  8.  Code status: Full code. I would consider asking palliative care team to see this patient to assist with long-term goals and goal of therapy. He is a full code at this point.   CRITICAL CARE TIME SPENT ON THIS PATIENT:  50 minutes.  ____________________________ Lacie ScottsShreyang H. Allena KatzPatel, MD shp:jcm D: 07/09/2013 18:12:25 ET T: 07/09/2013 18:36:51 ET JOB#: [id number]  cc: Theodoros Stjames H. Allena KatzPatel, MD, <Dictator> Charise CarwinSHREYANG H Calvina Liptak MD ELECTRONICALLY SIGNED 07/20/2013 13:26

## 2014-10-22 NOTE — Op Note (Signed)
PATIENT NAME:  Jerry NevinHEATH, [name] MR#:  [id number] DATE OF BIRTH:  27-Jun-1933  DATE OF PROCEDURE:  07/18/2013  PREOPERATIVE DIAGNOSES:  1.  End-stage renal disease.  2.  Hypotension precluding conventional hemodialysis.  3.  Atrial fibrillation.  4.  Coronary artery disease.   POSTOPERATIVE DIAGNOSES: 1.  End-stage renal disease.  2.  Hypotension precluding conventional hemodialysis.  3.  Atrial fibrillation.  4.  Coronary artery disease.   PROCEDURES:  1.  Ultrasound guidance for vascular access, right femoral vein.  2.  Placement of a 30 cm long DuoGlide Dialysis catheter, right femoral vein.   SURGEON: Festus BarrenJason Kalen Ratajczak, MD and [place]mar[place]aney, [place] JerseyPA-C.  ANESTHESIA: Local.   ESTIMATED BLOOD LOSS: Minimal.   INDICATION FOR PROCEDURE: This is a 81 year old male with end-stage renal disease. His PermCath is not functioning well for CRRT, which is typical. He cannot have conventional hemodialysis. We are asked to place a dialysis catheter for CRRT.   DESCRIPTION OF PROCEDURE: The patient's right groin was sterilely prepped and draped and a sterile surgical field was created. The right femoral vein was visualized with ultrasound and found to be widely patent. It was then accessed under direct ultrasound guidance without difficulty with a Seldinger needle. A J-wire was then placed. After skin nick and dilatation, a 30 cm long DuoGlide dialysis catheter was placed over the wire and the wire was removed. Both lumens withdrew dark red non-pulsatile blood and flushed easily with sterile saline. It was secured at 30 cm with 3 nylon sutures.   ____________________________ Annice NeedyJason S. Sheretta Grumbine, MD jsd:aw D: 07/18/2013 14:00:25 ET T: 07/19/2013 07:37:31 ET JOB#: [id number]  cc: Annice NeedyJason S. Aaleah Hirsch, MD, <Dictator> Annice NeedyJASON S Vergil Burby MD ELECTRONICALLY SIGNED 07/19/2013 11:47

## 2014-10-22 NOTE — Consult Note (Signed)
Assessment/Plan:  Assessment/Plan:  Assessment Attempted to see pt @ 1222.  Pt was not in room.  Notes reviewed/Hgb stable.  Plans being made for discharge.  Please call if you have any GI questions or concerns   Electronic Signatures: Joselyn ArrowJones, Keishawn Darsey L (NP)  (Signed 05-Jan-15 13:44)  Authored: Assessment/Plan   Last Updated: 05-Jan-15 13:44 by Joselyn ArrowJones, Romello Hoehn L (NP)

## 2014-10-22 NOTE — Consult Note (Signed)
Chief Complaint:  Subjective/Chief Complaint Denies abdominal pain.  Denies nausea, vomiting, rectal bleeding or melena.   VITAL SIGNS/ANCILLARY NOTES: **Vital Signs.:   02-Jan-15 06:12  Vital Signs Type Routine  Temperature Temperature (F) 97.6  Celsius 36.4  Pulse Pulse 88  Pulse source if not from Vital Sign Device per Telemetry Clerk; AF  Respirations Respirations 20  Systolic BP Systolic BP 76  Diastolic BP (mmHg) Diastolic BP (mmHg) 50  Mean BP 58  Pulse Ox % Pulse Ox % 94  Pulse Ox Activity Level  At rest  Oxygen Delivery 3L; Nasal Cannula   Brief Assessment:  GEN well developed, well nourished, no acute distress, A/Ox3.   Cardiac Regular   Respiratory normal resp effort   Gastrointestinal Normal   Gastrointestinal details normal Soft  Nontender  Nondistended   Additional Physical Exam Skin: warm, dry, + ecchymoses   Lab Results: Routine Chem:  02-Jan-15 04:56   Magnesium, Serum  1.7 (1.8-2.4 THERAPEUTIC RANGE: 4-7 mg/dL TOXIC: > 10 mg/dL  -----------------------)   Assessment/Plan:  Assessment/Plan:  Assessment Upper GI bleed: Stable.  No further bleeding. Anemia: H/H stable.   Plan 1) Continue PPI 2) Continue supportive measures Please call if you have any questions or concerns   Electronic Signatures: Joselyn ArrowJones, Zyshonne Malecha L (NP)  (Signed 02-Jan-15 10:32)  Authored: Chief Complaint, VITAL SIGNS/ANCILLARY NOTES, Brief Assessment, Lab Results, Assessment/Plan   Last Updated: 02-Jan-15 10:32 by Joselyn ArrowJones, Amica Harron L (NP)

## 2014-10-22 NOTE — Discharge Summary (Signed)
PATIENT NAME:  Jerry NevinHEATH, Burlin R MR#:  [id number] DATE OF BIRTH:  03/31/1933  DATE OF ADMISSION:  06/16/2013 DATE OF DISCHARGE: 07/06/2013   DISCHARGE DIAGNOSES:  1.  Acute gastrointestinal bleeding, upper, with profound anemia. 2.  Persistent, severe hypotension, requiring pressor agents.  3.  End-stage renal disease.  4.  Low-grade congestive heart failure from volume excess.  5.  Chronic appendicitis.  6.  Diabetes mellitus, insulin requiring.  7.  Severe coronary artery disease.  8.  Aortic stenosis, severe.  9.  Chronic atrial fibrillation.   DISCHARGE MEDICATIOS: Vitamin D3 1000 units daily, Centrum daily, calcium 667 mg daily, Tylenol No. 3 q.6 hours p.r.n. pain, Lantus SoloSTAR insulin 10 units daily, DuoNeb SVN t.i.d., guiafenesin 600 mg b.i.d., Midodrine 10 mg t.i.d., pantoprazole 40 mg b.i.d. and ondansetron 4 mg q.8 hours p.r.n. nausea.   REASON FOR ADMISSION: A 81 year old male presents with profound hypotension and GI bleed. Please see H and P for HPI, past medical history and physical exam.   HOSPITAL COURSE: The patient was admitted, was severely hypotensive and was on Levophed for approximately 10 days. Finally, a map of 50 was acceptable and the Levophed was weaned off with blood pressure systolic in the 70s. He seemed to tolerate that okay. His hemoglobin stabilized and the 8 range with IV Protonix. He was not scoped due to his hemodynamic instability. He had a JP drain in for conservative treatment of appendicitis that ultimately was taken out. He received 2 weeks of IV ertapenem. His abdominal symptoms were stable. Due to his hypotension, he had significant volume retention and trouble dialyzing that off. In the last few days, there has been extra dialysis trying to get that fluid excess off. His sugars are reasonably stable with some Lantus plus sliding scale. His pressures did seem to respond well to Midodrine and that can be weaned off over time as his pressures are tolerant. He  does have severe aortic stenosis, which complicates the whole process. Overall prognosis is fair, very weak and will be getting physical therapy and occupational therapy.  ____________________________ Danella PentonMark F. Miller, MD mfm:aw D: 07/06/2013 07:24:05 ET T: 07/06/2013 07:36:26 ET JOB#: [id number]  cc: Danella PentonMark F. Miller, MD, <Dictator> MARK Sherlene ShamsF MILLER MD ELECTRONICALLY SIGNED 07/07/2013 8:04

## 2014-10-22 NOTE — Consult Note (Signed)
PATIENT NAME:  Jerry Allen, Yeiden R MR#:  [id number] DATE OF BIRTH:  Jul 16, 1932  DATE OF CONSULTATION:  06/17/2013  REFERRING PHYSICIAN:  Bethann PunchesMark Miller, MD CONSULTING PHYSICIAN:  Joselyn ArrowKandice L. Kathryn Cosby, NP/Dr. Midge Miniumarren Wohl.  PRIMARY GASTROENTEROLOGIST:  Dr. Marva PandaSkulskie.  PRIMARY CARE PHYSICIAN:  Bethann PunchesMark Miller, MD  CARDIOLOGIST:  Dr. Darrold JunkerParaschos.   REASON FOR CONSULTATION:  Melena, vomiting "black clots" and anemia.   HISTORY OF PRESENT ILLNESS:  Jerry Allen is a pleasant 81 year old Caucasian male who has multiple comorbidities including end-stage chronic kidney disease on dialysis, cirrhosis, chronic anemia of chronic disease, coronary artery disease, congestive heart failure, cardiomyopathy, status post CABG and valve replacement, atrial fibrillation on chronic Coumadin, peripheral vascular disease who presents after a recent hospitalization for appendiceal abscess. He was discharged from the hospital December 9th with treatment of antibiotics per the surgical team and after percutaneous drainage placed by Interventional Radiology. His wife states that he had done well until 2 days ago when he developed nausea, vomiting and diarrhea. He vomited about 4 times and had 4 loose, dark stools at home and the following morning he vomited what looked like black clots. He denies abdominal pain at this time. He denies any over-the-counter NSAIDs. He has had a C. diff, which was negative. He had a 3-way of the abdomen, which showed air-fluid levels, mild dilated loops of bowel and an infrarenal aortic aneurysm. He had a CT scan of abdomen and pelvis without contrast, which showed cirrhosis, residual fluid about the tip of the pigtail drainage catheter located inferior to the right hepatic lobe, a discrete, measurable fluid collection was not really identified, ascites, which was slightly decreased and small, chronic appearing, bilateral pleural effusions, left greater than right with probable atelectasis in the right lower lobe and  infrarenal aortic aneurysm. His hemoglobin was 7.7 on admission. It was 8.8 at the time of discharge. He has received 1 unit of packed RBCs and his hemoglobin is 7.6 today. His INR is 1.8. His INR was 1.6 on the 17th. His last colonoscopy was by Dr. Marva PandaSkulskie 05/01/2012, and he had 5 polyps removed and internal hemorrhoids. The polyps were tubular adenomas and hyperplastic. He had an EGD on the same date, which showed erosive esophagitis and gastritis with negative H. pylori.    PAST MEDICAL AND SURGICAL HISTORY: 1.   Erosive esophagitis/gastritis.  2.  Tubular adenomas of the colon with the last colonoscopy 05/01/2012 by Dr. Marva PandaSkulskie.  3.  Cirrhosis.  4.  Anemia of chronic kidney disease.  5.  Coronary artery disease.  6.  Congestive heart failure.  7.  Cardiomyopathy, status post valve replacement and CABG.  8.  End-stage renal disease, previously on peritoneal dialysis with a history of peritonitis, now on hemodialysis.  9.  Atrial fibrillation on chronic Coumadin for over 10 years.  10.  Peripheral vascular disease. 11.  Venous insufficiency.  12.  A history of pilonidal cyst 13.  Total knee replacement.  14.  Cataract surgeries. 15.  Detached retina.   ALLERGIES:  IRON causes GI distress, nausea and vomiting.   MEDICATIONS PRIOR TO ADMISSION: 1.  Acetaminophen/hydrocodone 325/5 mg q.6 hours p.r.n.  2.  Bayer aspirin 81 mg daily.  3.  Calcium acetate 667 mg daily.  4.  Centrum Special Heart 1 tab in the morning.  5.  Cipro 500 mg b.i.d. for 7 days.  6.  Crestor 20 mg at bedtime.  7.  Lantus insulin 30 units subcutaneous in the morning.  8.  Flagyl 500 mg  q.8 hours for 7 days.  9.  Pantoprazole 40 mg q.a.m. 10.  Probiotics daily.  11.  Sucralfate 1 gram t.i.d. 12.  Torsemide 20 mg in the morning.  13.  Vitamin D3 1000 international units at noon.  14.  Coumadin 3 mg 1/2 tablet on Tuesday, Thursday, Saturday and Sunday and 1 tablet on Monday, Wednesday, Friday.   FAMILY  HISTORY:  Positive for a brother with liver cancer, etiology unknown.   SOCIAL HISTORY:  He quit smoking 10 years ago. Denies any alcohol or illicit drug use. 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 He is married.   REVIEW OF SYSTEMS:  See history of present illness, otherwise  negative 10-point review of systems.   PHYSICAL EXAMINATION:  VITAL SIGNS:  Temperature 97.7, pulse 96, respirations 18, blood pressure 103/60, O2 sat 95% on 2 L/min via nasal cannula.  GENERAL:  He is an elderly, Caucasian male who is alert, oriented and cooperative, and in no acute distress. His wife is at the bedside.  HEENT:  Sclerae clear, anicteric. Conjunctivae pink. Oropharynx pink and moist without any lesions.  NECK:  Supple without mass or thyromegaly.  CHEST:  Heart rate is irregularly irregular. He has a 3/6 murmur noted and a click.  LUNGS:  With decreased breath sounds bilaterally and scant expiratory wheezes bilaterally. No acute distress.  ABDOMEN:  Protuberant with positive bowel sounds x 4. No bruits auscultated. Abdomen is soft, nondistended, nontender, without palpable mass or hepatosplenomegaly. He has a dressing over a drain intact to the right lower quadrant. The exam limited given patient's body habitus.  EXTREMITIES:  He has bilateral Ace wraps around both lower extremities. He has good pedal pulses. No pedal edema noted or cyanosis.  SKIN:  Pink, warm and dry.  NEUROLOGIC:  Grossly intact. Strength:  Good equal strength bilaterally.  PSYCHIATRIC:  Alert, cooperative, normal mood and affect.   LABORATORY STUDIES:  See HPI. Glucose 141, BUN 113, creatinine of 6.7, otherwise normal BMP. Magnesium 2.3, lipase 298, albumin 2.4, otherwise normal LFTs. Troponin 0.21, Total CK 42, CK-MB 2.4. White blood cell count 9.4, hematocrit 23.7, platelets 181, MCV 86, prothrombin 20.8. C. diff. negative.   IMPRESSION:  Jerry Allen is a pleasant 81 year old Caucasian  male recently admitted with appendiceal abscess status post percutaneous drainage by IR on Cipro and Flagyl, aspirin and Coumadin at home who presented with diarrhea, abdominal pain, nausea, vomiting black "clots" and melena. His hemoglobin was 7.7 upon admission and only 7.6 after 1 unit of packed RBCs. Likely the patient has upper gastrointestinal bleed secondary to peptic ulcer disease. Discussed with Dr. Servando Snare. His INR is 1.8 and he is on pressors at this time. EGD when stable. We will place an NG tube and lavage to look for active bleeding. If bright-red blood, EGD to be done more urgently, otherwise we will wait for INR close to 1.5, and the patient clinically more stable given increased risk with sedation and bleeding complications. He also has cirrhosis, likely due to his congestive heart failure. Agree with transfusion to keep hemoglobin above 8 grams given cardiac issues and recent peritonitis.   PLAN:  1.  nasogastric tube to low continuous suction and lavage with tap water, R.N. to page me with the results.  2.  Monitor hemoglobin and hematocrit, transfuse red blood cells p.r.n.  3.  Agree with proton pump inhibitor.  4.  EGD in the near future with Dr. Servando Snare and discussed risks  and benefits which include, but are not limited to, bleeding, infection, perforation, drug reaction and he agrees and consent will be obtained.  We would like to thank you for allowing [place] to participate in the care of Mr. Reppond.    ____________________________ Joselyn Arrow, NP klj:jm D: 06/17/2013 12:48:55 ET T: 06/17/2013 13:08:43 ET JOB#: [id number]  cc: Joselyn Arrow, NP, <Dictator> Danella Penton, MD Joselyn Arrow FNP ELECTRONICALLY SIGNED 07/14/2013 20:53

## 2014-10-22 NOTE — Consult Note (Signed)
   Comments   Called by Dr Holley Raring who has again met with pt's wife. I also met with pt's wife and daughter who both say they have talked with patient who has requested the discontinuation of CRRT and just wants to be comfortable. I explained comfort care to her and she says she agrees. She asks about pt going home with hospice but says she would prefer the [facility]. Will ask that the hospice liaison meet with family. Although I understand that there are no beds available currently.  care for hospice transfer when bed is available if pt is stable to move  Electronic Signatures: Borders, Kirt Boys (NP)  (Signed 19-Jan-15 13:17)  Authored: Palliative Care   Last Updated: 19-Jan-15 13:17 by Irean Hong (NP)

## 2014-10-22 NOTE — Consult Note (Signed)
Brief Consult Note: Diagnosis: intermitant wide complex tachycardia and hypotension during dialysis.   Comments: 81 year old Caucasian male past medical history of congestive heart failure with ejection fraction of 30%, atrial fibrillation, history of cardiac valve repair, coronary artery disease status post CABG, diabetes mellitus, hyperlipidemia, hypertension, obesity, anemia chronic kidney disease, secondary hyperparathyroidism, metabolic acidosis, pseudomonal peritonitis 07/2012  Pt is in afib with variable ventricular response. Wide complex tachycardia may be abearant afib vs ventricular arrythmia. Not a candidate for beta blocker therapy due to hypotension with hemodialysis. Will follow electrolytes and heart rate..  Electronic Signatures: Dalia HeadingFath, Kenneth A (MD)  (Signed 02-Jan-15 18:18)  Authored: Brief Consult Note   Last Updated: 02-Jan-15 18:18 by Dalia HeadingFath, Kenneth A (MD)

## 2014-10-22 NOTE — Discharge Summary (Signed)
ADDENDUM  PATIENT NAME:  Jerry NevinHEATH, Osbaldo R MR#:  [id number] DATE OF BIRTH:  1932-10-06  DATE OF ADMISSION:  06/16/2013 DATE OF DISCHARGE: 07/07/2013   The patient had respiratory compromise on the day of discharge yesterday, January 06. He was very volume overloaded. They removed 4.5 kg of fluid on emergent dialysis. His volume status now is actually quite excellent. His right arm edema has resolved. He has no crackly lung sounds or upper airway congestion. He is off oxygen, essentially dramatically improved. He will be discharged on the above medications without the midodrine as that could be causing cardiac stress. He does tolerate systolic blood pressures of 70 to 5, which is very atypical for him and can be dialyzed on those pressures as well.  ____________________________ Danella PentonMark F. Lanore Renderos, MD mfm:aw D: 07/07/2013 07:34:22 ET T: 07/07/2013 07:46:37 ET JOB#: [id number]  cc: Danella PentonMark F. Leatta Alewine, MD, <Dictator> Reeanna Acri Sherlene ShamsF Anwitha Mapes MD ELECTRONICALLY SIGNED 07/07/2013 8:04

## 2014-10-22 NOTE — Consult Note (Signed)
PATIENT NAME:  Jerry Allen, Jerry Allen MR#:  [id number] DATE OF BIRTH:  June 20, 1933  DATE OF CONSULTATION:  07/10/2013  CONSULTING PHYSICIAN:  Marcina Millard, MD  PRIMARY CARE PHYSICIAN: Dr. Hyacinth Meeker  CHIEF COMPLAINT: Low blood pressure.   REASON FOR CONSULTATION: Consultation requested for evaluation of borderline elevated troponin.   HISTORY OF PRESENT ILLNESS: The patient is a 81 year old gentleman with known coronary artery disease, status post bypass graft surgery, ischemic cardiomyopathy, chronic systolic congestive heart failure, atrial fibrillation, end-stage renal disease on chronic hemodialysis, history of GI bleed, referred for evaluation of hypotension and borderline elevated troponin. The patient was recently hospitalized following a recent history of upper GI bleed, chronic appendicitis. He was admitted with hypotension, requiring several days of intravenous pressor therapy. The patient was discharged home only to return after becoming hypotensive during dialysis, again, requiring intravenous pressor therapy. The patient denies chest pain. Admission labs were notable for borderline elevated troponin of 0.2 without significant peak or trough in the absence of chest pain or ECG changes.   PAST MEDICAL HISTORY: 1.  Coronary artery disease, status post coronary artery bypass graft surgery, 11/03/2002.  2.  Ischemic cardiomyopathy with chronic systolic congestive heart failure.  3.  Atrial fibrillation.  4.  Moderate to severe aortic stenosis with calculated aortic valve area of 0.84 cm/sq with a mean gradient of 10 mmHg.  5.  Hyperlipidemia.  6.  Diabetes.  7.  End-stage renal disease, on chronic hemodialysis. 8.  History of GI bleed.  9.  Chronic appendicitis.  HOME MEDICATIONS:  Vitamin D3 1000 units daily, Centrum Silver 1 daily, Tylenol No. 3 q. 6 p.r.n., Lantus SoloSTAR 10 units at bedtime, DuoNebs t.i.d., guaifenesin 600 mg b.i.d., midodrine 10 mg t.i.d., Protonix 40 mg b.i.d., Zofran  4 mg p.r.n.   SOCIAL HISTORY: The patient previously was living at home, currently is at a skilled nursing facility prior to this admission. He quit tobacco abuse 10 years ago.   FAMILY HISTORY: No immediate family history for coronary artery disease or myocardial infarction.   REVIEW OF SYSTEMS:  CONSTITUTIONAL: No fever or chills.  EYES: No blurry vision.  EARS: No hearing loss.  RESPIRATORY: Shortness of breath.  CARDIOVASCULAR: Denies chest pain.  GASTROINTESTINAL: Has intermittent mild nausea.  GENITOURINARY: No dysuria or hematuria.  ENDOCRINE: No polyuria or polydipsia.  MUSCULOSKELETAL: No arthralgias or myalgias.  NEUROLOGICAL: No focal muscle weakness or numbness.  PSYCHOLOGICAL: No depression or anxiety.   PHYSICAL EXAMINATION: VITAL SIGNS: Blood pressure 109/66, pulse 101, respirations 21, temperature 97.7 and pulse oximetry 100%.  HEENT: Pupils equal, reactive to light and accommodation.  NECK: Supple without thyromegaly.  LUNGS: Clear.  CARDIOVASCULAR: Normal JVP. Normal PMI. Regular rate and rhythm. Normal S1, S2. Grade 2/6 crescendo/decrescendo  systolic murmur.  ABDOMEN: Soft and nontender without hepatosplenomegaly.  EXTREMITIES: There is trace to 1+ bilateral pedal edema.  MUSCULOSKELETAL: Normal muscle tone.  NEUROLOGIC: The patient is alert and oriented x 3. Motor and sensory both grossly intact.   IMPRESSION: A 81 year old gentleman with known coronary artery disease, status post bypass graft surgery, ischemic cardiomyopathy with chronic systolic congestive heart failure, moderate to severe aortic stenosis, chronic atrial fibrillation, end-stage renal disease on chronic hemodialysis, with history of recent gastrointestinal bleed, chronic appendicitis who is readmitted with hypotension during dialysis in the absence of chest pain. The patient has borderline elevated troponin likely due to demand supply ischemia and unlikely due to acute coronary syndrome.    RECOMMENDATIONS: 1.  Continue present medications.  2.  Would defer full dose anticoagulation.  3.  Would defer chronic anticoagulation for atrial fibrillation in light of recent GI bleed and multiple comorbidities.  4.  Would defer further cardiac diagnostics at this time.      ____________________________ Marcina MillardAlexander Jennipher Weatherholtz, MD ap:dp D: 07/10/2013 10:03:17 ET T: 07/10/2013 10:24:16 ET JOB#: [id number]  cc: Marcina MillardAlexander Demitra Danley, MD, <Dictator> Marcina MillardALEXANDER Rhilee Currin MD ELECTRONICALLY SIGNED 08/17/2013 12:28

## 2014-10-22 NOTE — Consult Note (Signed)
Chief Complaint:  Subjective/Chief Complaint Denies abdominal pain.  Denies Nausea, vomiting, rectal bleeding or melena.   VITAL SIGNS/ANCILLARY NOTES: **Vital Signs.:   01-Jan-15 09:00  Vital Signs Type Routine  Temperature Temperature (F) 98.4  Celsius 36.8  Temperature Source axillary  Pulse Pulse 92  Respirations Respirations 16  Systolic BP Systolic BP 77  Diastolic BP (mmHg) Diastolic BP (mmHg) 51  Mean BP 59  Pulse Ox % Pulse Ox % 100  Pulse Ox Activity Level  At rest  Oxygen Delivery High Flow Nasal Cannula; 34%  Pulse Ox Heart Rate 90   Brief Assessment:  GEN well developed, well nourished, no acute distress, A/Ox3.   Cardiac Regular   Respiratory normal resp effort   Gastrointestinal Normal   Gastrointestinal details normal Soft  Nontender  Nondistended   Additional Physical Exam Skin: warm, dry, + ecchymoses   Lab Results: Routine Chem:  01-Jan-15 03:41   Glucose, Serum  145  BUN  41  Creatinine (comp)  4.47  Sodium, Serum 139  Potassium, Serum 4.7  Chloride, Serum 99  CO2, Serum  33  Calcium (Total), Serum 9.1  Anion Gap 7  Osmolality (calc) 290  eGFR (African American)  14  eGFR (Non-African American)  12 (eGFR values <59m/min/1.73 m2 may be an indication of chronic kidney disease (CKD). Calculated eGFR is useful in patients with stable renal function. The eGFR calculation will not be reliable in acutely ill patients when serum creatinine is changing rapidly. It is not useful in  patients on dialysis. The eGFR calculation may not be applicable to patients at the low and high extremes of body sizes, pregnant women, and vegetarians.)  Routine Hem:  01-Jan-15 03:41   Hemoglobin (CBC)  8.6 (Result(s) reported on 01 Jul 2013 at 04:06AM.)   Assessment/Plan:  Assessment/Plan:  Assessment Upper GI bleed: Stable.  No further bleeding. Anemia: H/H stable.   Plan 1) Continue PPI 2) Continue supportive measures Please call if you have any  questions or concerns   Electronic Signatures: JAndria Meuse(NP)  (Signed 01-Jan-15 10:41)  Authored: Chief Complaint, VITAL SIGNS/ANCILLARY NOTES, Brief Assessment, Lab Results, Assessment/Plan   Last Updated: 01-Jan-15 10:41 by JAndria Meuse(NP)

## 2014-10-23 NOTE — H&P (Signed)
PATIENT NAME:  Jerry Allen, Jerry Allen MR#:  [id number] DATE OF BIRTH:  [birth year]  DATE OF ADMISSION:  09/10/2011  REFERRING PHYSICIAN: Dr. Governor Rooks    PRIMARY CARE PHYSICIAN: Dr. Mila Merry   PRIMARY NEPHROLOGISTS: Dr. Cherylann Ratel and Dr. Thedore Mins   REASON FOR ADMISSION: Persistent hypoglycemia.   HISTORY OF PRESENT ILLNESS: This is a 81 year old white male with past medical history of CKD stage IV on glipizide and Lantus for diabetes who was not able to take his medications, had three episodes this morning of low blood sugar as low as 50's. He's had worsening kidney function. He is getting set up for possible dialysis in the future by his nephrologists. He saw  them last week. He states he almost passed out as per his wife. He had no chest pain, shortness of breath, nausea, vomiting, diarrhea. He felt very weak. He got 2 amps of D50 and was found to have a blood sugar initially of 41, failed D50 initially, repeat amp of D50 was given and blood sugar came up to 112. He has been now on D5 half normal saline. Blood sugar currently is at 109. We are asked to admit the patient for persistent hypoglycemia.   PAST MEDICAL HISTORY:  1. Atrial fibrillation. 2. Congestive heart failure.  3. Valve replacement, not sure which valve. 4. Coronary artery disease. 5. Bypass graft. 6. Four vessel disease.  7. CKD, stage IV. 8. Diabetes. 9. Hypercholesterolemia.  10. Hypertension.   PAST SURGICAL HISTORY: 1. Coronary artery bypass graft in 2004. 2. Knee replacement. 3. Right eye surgery for macular degeneration and detached retina. 4. Cataract surgery.  5. Pilonidal cyst in 1956.  DRUG ALLERGIES: Iron.   HOME MEDICATIONS: 1. Isosorbide mononitrate 10 mg t.i.d.  2. Hydralazine 10 mg t.i.d.  3. Toprol-XL 50 mg b.i.d.  4. Aspirin 81 mg daily.   5. Pepcid 20 mg daily.  6. Warfarin 3 mg on Saturday, Sunday, Tuesday, Thursday, Friday; 4.5 mg on Monday and Wednesday. 7. Vitamin D 1000 international units  daily.  8. Crestor 20 mg daily. 9. Glipizide 10 mg daily. 10. Sodium bicarbonate 325 mg daily.  11. Lantus 30 units daily. 12. Torsemide 40 mg in the morning, 20 mg in the evening.    SOCIAL HISTORY: He is married with one daughter. He has a 40-pack-year history, quit in February 2004. Does not use illicit drugs. No alcohol. 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 He lives at home with his wife. He is able to do his activities of daily living.   FAMILY HISTORY: Father died of congestive heart failure at age 49. One brother status post angioplasty and cardiac stent. His mother died of dementia. He is not sure how old but she was greater than 70.  REVIEW OF SYSTEMS: CONSTITUTIONAL: No fever, fatigue, weakness. EYES: No blurred vision, double vision, pain, redness, inflammation, glaucoma. ENT: No tinnitus, ear pain, hearing loss, seasonal allergies, epistaxis, discharge. RESPIRATORY: No cough, wheeze, hemoptysis, dyspnea, asthma, painful respirations. CARDIOVASCULAR: No chest pain, orthopnea, edema, dyspnea on exertion, palpitations. GI: No nausea, vomiting, diarrhea, abdominal pain, hematemesis, melena, gastroesophageal reflux disease. GU: No dysuria, hematuria, renal calculi, increased frequency, incontinence. GU male. No sores, discharge, prostatitis. ENDOCRINE: No polyuria, nocturia, thyroid problems, increased sweating, heat or cold intolerance. HEME/LYMPH: No anemia, bruising, swollen glands. INTEGUMENTARY: No acne, rash, change in mole, hair or skin. MUSCULOSKELETAL: No pain in back, shoulder, knee, hip, arthritis, swelling, gout. NEUROLOGIC: No numbness, weakness, dysarthria, epilepsy, tremor, vertigo, ataxia. PSYCH: No  anxiety, insomnia, ADD, bipolar, depression.   PHYSICAL EXAMINATION:   VITAL SIGNS: Temperature 98.4, heart rate 94, blood pressure 185/91, sating 92% on room air.   GENERAL: The patient is well developed, well nourished, high BMI.   HEENT: Pupils equal and  reactive to light and accommodation. Extraocular movements intact. Anicteric sclerae. No difficulty hearing. Oropharynx clear.   NECK: No JVD. No thyromegaly. No lymphadenopathy. No carotid bruits.   LUNGS: Clear to auscultation. No adventitious breath sounds. No increased effort or use of accessory muscles.   CARDIOVASCULAR: Regular rate and rhythm. Normal S1, S2. PMI not lateralized. +1 edema bilaterally. 2+ dorsalis pedis pulses.  BREASTS: No obvious masses.    ABDOMEN: Soft, nontender, nondistended. Positive bowel sounds.   GU: Deferred.   MUSCULOSKELETAL: Strength 5 out of 5. No clubbing, cyanosis, DJD.   SKIN: No rashes, lesions, induration but he does have signs of chronic venostasis in his lower extremities.   LYMPH: No lymphadenopathy in the cervical area or supraclavicular area.   NEUROLOGIC: Cranial nerves II through XII are intact. Strength 5 out of 5. No dysarthria, aphasia, dysphagia, or contractures.   PSYCH: Alert and oriented x3 with good judgment.   LABORATORY DATA: Glucose 43, BUN 70, creatinine 5.03, sodium 143, potassium 4.8, chloride 108, bicarb 23, anion gap 12. Troponin 0.19. WBC 6.3, hemoglobin 8.8, hematocrit 27, platelets 117, MCV 89. EKG shows atrial fibrillation with no ST changes.   ASSESSMENT AND PLAN: This is a pleasant 81 year old white male with past medical history of congestive heart failure, coronary artery disease, atrial fibrillation, and diabetes who presents with hypoglycemia.  1. Hypoglycemia, persistent, required 2 amps of D50 x2. Normal saline. Will check Accu-Cheks q.2 hours. Give no supplemental insulin.  2. Elevated troponin. Most likely this is from demand ischemia from elevated blood pressure versus acute renal failure. Will cycle cardiac enzymes. The patient is already on Coumadin with INR therapeutic. Will continue Imdur and p.r.n. nitroglycerin. He does not have any chest pain currently. If troponin continues to rise, will consider  Cardiology consultation. In addition, will continue aspirin, Coumadin, Crestor, and metoprolol.  3. Atrial fibrillation. Will continue with Coumadin and metoprolol.  4. Malignant hypertension. Will continue hydralazine and metoprolol and see how this does and consider adjustment if necessary. 5. CKD, stage IV. Will consult Nephrology. The patient appears volume overloaded and will give Torsemide and consult Nephrology.  6. Coronary artery disease. Will continue aspirin, Crestor, and isosorbide dinitrate.  7. Anemia, most likely anemia of chronic kidney disease. Will check Hemoccult.  8. DVT prophylaxis. Maintain with aspirin and Coumadin. INR currently is 2.0.   CODE STATUS: FULL CODE.   TOTAL TIME SPENT ON ADMISSION: 55 minutes.  Thank you for allowing me to participate in the care of this patient.   ____________________________ Corie ChiquitoAmir A. Lafayette DragonFirozvi, MD aaf:drc D: 09/10/2011 19:45:16 ET T: 09/11/2011 06:01:45 ET JOB#: [id number]  cc: Karolee OhsAmir A. Lafayette DragonFirozvi, MD, <Dictator>, Demetrios Isaacsonald E. Sherrie MustacheFisher, MD, Munsoor Lizabeth LeydenN. Lateef, MD, Mosetta PigeonHarmeet Singh, MD Karolee OhsAMIR Laverda PageA Adelyna Brockman MD ELECTRONICALLY SIGNED 09/11/2011 10:31

## 2014-10-23 NOTE — Op Note (Signed)
PATIENT NAME:  Jerry NevinHEATH, Elias R MR#:  [id number] DATE OF BIRTH:  09/14/1932  DATE OF PROCEDURE:  10/25/2011  PREOPERATIVE DIAGNOSES:  1. Stage V renal insufficiency.  2. Cardiomyopathy.  3. Atrial fibrillation.  4. Obesity.   POSTOPERATIVE DIAGNOSES:  1. Stage V renal insufficiency.  2. Cardiomyopathy.  3. Atrial fibrillation.  4. Obesity.   PROCEDURE PERFORMED: Laparoscopic insertion of peritoneal dialysis catheter.   SURGEON: Renford DillsGregory G. Schnier, MD  ANESTHESIA: General by LMA.   FLUIDS: Per anesthesia record.   ESTIMATED BLOOD LOSS: Minimal.   SPECIMEN: None.   INDICATIONS: [name] is a 81 year old gentleman with multiple medical problems who has stage V renal insufficiency who has now elected to initiate peritoneal dialysis. The risks and benefits were reviewed with the patient for insertion of a peritoneal dialysis catheter. All questions are answered, patient agrees to proceed.   DESCRIPTION OF PROCEDURE: Patient is taken to the Operating Room, placed in supine position. After adequate general anesthesia is induced and appropriate invasive monitors are placed, he is positioned supine and his abdomen is prepped and draped in sterile fashion. Combination of 1% lidocaine with epinephrine and 0.25% Marcaine with epinephrine is then infiltrated in the soft tissues in the infraumbilical area and the left lower quadrant lateral to the rectus as well as in the presumed exit site and in between these two areas as well.   A curvilinear incision is created in the infraumbilical margin, carried down to expose the fascia. 0 Vicryl pursestring suture is placed and a bone hook is used to elevate the fascia. Veress needle is introduced in the middle of the pursestring. Saline test is used to verify intraperitoneal placement and low-flow insufflation is performed, subsequently high flow insufflation. The abdominal cavity is insufflated to 15 mmHg CO2 and a 10 mm trocar is introduced without  difficulty. Camera is then used to examine the intra-abdominal contents. No significant adhesions are noted. A small amount of bloody peritoneal fluid is identified.   A linear incision is then made in oblique fashion in the left lower quadrant just lateral to the rectus muscle, dissection is carried down to expose the fascia. Again a pursestring suture of 0 Vicryl is placed and the Seldinger needle is introduced in the middle of this pursestring. J-wire is then advanced under laparoscopic visualization followed by the dilator, peel-away sheath and then the double cuffed pigtail peritoneal dialysis catheter is introduced over a trocar. It is then advanced down into the pelvis directly and subsequently trocar is removed. The cuff is then slipped into the subfascial plane and the pursestring suture of 0 Vicryl is secured. The catheter is then tunneled and exits slightly more to the midline superiorly. Saline test is then performed; 200 mL is infused and approximately 180 mL returned. Hub is connected.   The gas is expelled after final positioning of the peritoneal dialysis catheter. Pursestring sutures both around the catheter cuff and the infraumbilical incision are then secured. The incisions are both closed using running 2-0 Vicryl followed by a running 3-0 Vicryl followed by 4-0 Monocryl subcuticular. Dermabond and sterile dressings are applied. Patient tolerated procedure well and there were no immediate complications. He was taken to the recovery room in stable condition.   ____________________________ Renford DillsGregory G. Schnier, MD ggs:cms D: 10/25/2011 15:54:18 ET T: 10/25/2011 16:34:14 ET JOB#: [id number]  cc: Renford DillsGregory G. Schnier, MD, <Dictator> Demetrios Isaacsonald E. Sherrie MustacheFisher, MD Mosetta PigeonHarmeet Singh, MD Renford DillsGREGORY G SCHNIER MD ELECTRONICALLY SIGNED 11/08/2011 7:51

## 2014-10-23 NOTE — Discharge Summary (Signed)
PATIENT NAME:  Jerry Allen, Jerry Allen MR#:  [id number] DATE OF BIRTH:  1933-01-07  DATE OF ADMISSION:  09/10/2011 DATE OF DISCHARGE:  09/12/2011  REFERRING PHYSICIAN: Dr. Governor Rooksebecca Lord  PRIMARY CARE PHYSICIAN: Dr. Mila Merryonald Fisher ADMITTING PHYSICIAN: Dr. Larena GlassmanAmir Samika Vetsch   DISCHARGING PHYSICIAN: Dr. Larena GlassmanAmir Adom Schoeneck   ADMITTING DIAGNOSIS: Persistent hypoglycemia.   DISCHARGE DIAGNOSES:  1. Persistent hypoglycemia, most likely due to insulin clearance with progressive renal failure.  2. Anemia. 3. Elevated troponin most likely from demand ischemia versus acute renal failure. 4. Atrial fibrillation. 5. Malignant hypertension.  6. Chronic kidney disease stage IV. 7. Coronary artery disease.   CONSULTANTS:  1. Dr. Thedore MinsSingh.  2. Case management.  3. Physical therapy.  4. Occupational therapy.   LABORATORY, DIAGNOSTIC AND RADIOLOGICAL DATA: Labs at the time of discharge: Glucose 144, BUN 71, creatinine 5.09, sodium 142, potassium 5.7, chloride 107, white count 6.8, hemoglobin 9.3, hematocrit 29.6, platelets 134, MCV 90.   HOSPITAL COURSE: Initial history and physical were done by myself. Please refer to my note dated 09/10/2011 for complete details. In brief, this is a 81 year old white male with past medical history of chronic kidney disease stage IV, on glipizide and Lantus for diabetes who was not able to take his medications, had three episodes the morning of admission with low blood sugars as low as 50s and 40s. He has had worsening kidney function, is being set up for dialysis. He stated he almost passed out as per his wife. He had no chest pain, nausea, shortness of breath, nausea, vomiting, diarrhea. He felt weak and received 2 amps of D50 and then was put on D5 half-normal saline. His sugars improved. He was held off glipizide. 1. Persistent hypoglycemia. A1c was checked and found to be 6.0. Sugars were well maintained. He was tolerating diet. He was recommended to continue with Lantus but to discontinue  glipizide. Most likely the etiology of his persistent hypoglycemia was poor insulin clearance to due progressive renal failure.  2. Anemia with coronary artery disease. Patient has had some drop in his hemoglobin most likely secondary to chronic kidney disease stage IV and he was transfused 1 unit of PRBC. He may need Epogen in the future. His Hemoccult was negative.  3. Elevated troponin most likely in the setting of demand ischemia versus from elevated blood pressure versus acute renal failure. He was continued on Imdur, p.Allen.n. nitroglycerin. He had no chest pain, has had some elevations of his troponin in past.  4. Atrial fibrillation. We held his Coumadin due to drop in hemoglobin. We resumed it because he had guaiac-negative stool.  5. Malignant hypertension. Patient was continued on hydralazine and metoprolol.  6. Chronic kidney disease stage IV. Nephrology evaluated patient and recommended dialysis. He will probably been seen by Dr. Wyn Quakerew as outpatient for PD catheter placement. 7. Coronary artery disease. Patient was continued on aspirin, isosorbide mononitrate and Crestor.  8. Deep vein thrombosis prophylaxis. Maintain with aspirin.   PHYSICAL EXAMINATION: VITAL SIGNS: Temperature 98.1, heart rate 79, respiratory rate 18, blood pressure 146/67, sating 93% on room air. LUNGS: Clear to auscultation. CARDIOVASCULAR: Regular rate and rhythm. ABDOMEN: Benign.   DISCHARGE MEDICATIONS: Patient was discharged on the following medications:  1. Isosorbide dinitrate t.i.d.   2. Hydralazine 10 mg t.i.d.  3. Toprol-XL 50 mg b.i.d.  4. Aspirin 81 mg daily.  5. Pepcid 20 mg daily. 6. Centrum multivitamin 1 tablet daily.  7. Warfarin 3 mg, Saturday Sunday, Tuesday, Thursday, Friday; warfarin 4.5 mg Monday, Wednesday.  8. Vitamin D 1000 international units daily.  9. Sodium bicarbonate 325 mg daily.  10. Crestor 20 mg at bedtime. 11. Lantus 30 units once a day.  12. Torsemide 40 mg in the morning and 20  mg in the p.m.   DIET: Low sodium, ADA, renal diet.   ACTIVITY: As tolerated with use of walker.   FOLLOW UP: Patient should follow up with Dr. Sherrie Mustache in one week, Dr. Cherylann Ratel in two weeks and should stop glipizide.   CODE STATUS: FULL CODE.   TOTAL TIME SPENT ON DISCHARGE: 45 minutes.   ____________________________ Corie Chiquito Lafayette Dragon, MD aaf:cms D: 09/12/2011 21:49:12 ET T: 09/13/2011 11:08:44 ET JOB#: [id number]  cc: Karolee Ohs A. Lafayette Dragon, MD, <Dictator> Demetrios Isaacs. Sherrie Mustache, MD Annice Needy, MD Munsoor Lizabeth Leyden, MD Mosetta Pigeon, MD Karolee Ohs Laverda Page MD ELECTRONICALLY SIGNED 09/13/2011 14:23

## 2014-10-30 NOTE — Discharge Summary (Signed)
PATIENT NAME:  Jerry NevinHEATH, Jerry Allen MR#:  [id number] DATE OF BIRTH:  1932-09-16  DATE OF ADMISSION:  07/09/2013 DATE OF DISCHARGE:  07/20/2013  DISCHARGE DIAGNOSES:  1.  Severe hypotension, requiring full pressors.  2.  Severe aortic stenosis, calcific.  3.  Acute on chronic congestive heart failure. 4.  End-stage renal disease with continuous dialysis.  5.  History of gastrointestinal bleed.  6.  Diabetes mellitus.   DISCHARGE MEDICATIONS: Hospice home orders.   REASON FOR ADMISSION: A 81 year old male, who presents with severe hypotension and CHF. Please see H and P for HPI, past medical history and physical exam.   HOSPITAL COURSE: The patient was admitted. He was on Levophed for 4 to 5 days. Midodrine 10 mg t.i.d. was started to maintain his blood pressure. Levophed was weaned off. He had progressive CHF even with 3 time a week dialysis. He underwent continuous dialysis for approximately 7 to 10 days with really no improvement in his CHF. It became clear that with his severe aortic stenosis and cardiomyopathy, that he was making no progress on dialysis and is becoming a terminal care issue. Much discussion occurred with his wife. Finally after almost 2 weeks of therapy, his wife decided, with the patient, for comfort care. He is transferred to the hospice home. ____________________________ Danella PentonMark F. Iam Lipson, MD mfm:aw D: 03/24/2014 08:04:09 ET T: 03/24/2014 08:12:33 ET JOB#: [id number]  cc: Danella PentonMark F. Kathyleen Radice, MD, <Dictator> Barak Bialecki Sherlene ShamsF Janessa Mickle MD ELECTRONICALLY SIGNED 07/22/2013 7:32

## 2014-12-18 IMAGING — CT CT ABD-PELV W/O CM
2 of 4 series · 17 of 46 positions shown, 19 images · non-contrast
Comparison: 06/07/2013.

CLINICAL DATA: Diarrhea with nausea and vomiting.  Recent abscess.

EXAM:
CT ABDOMEN AND PELVIS WITHOUT CONTRAST
TECHNIQUE: Multidetector CT imaging of the abdomen and pelvis was performed
following the standard protocol without intravenous contrast.

[Series 2: routine abd pel wo · axial · 0.94mm/px · z∈[-864,-454]mm · 14 of 90 slices shown, 16 images]
[im 4/90  soft-tissue]
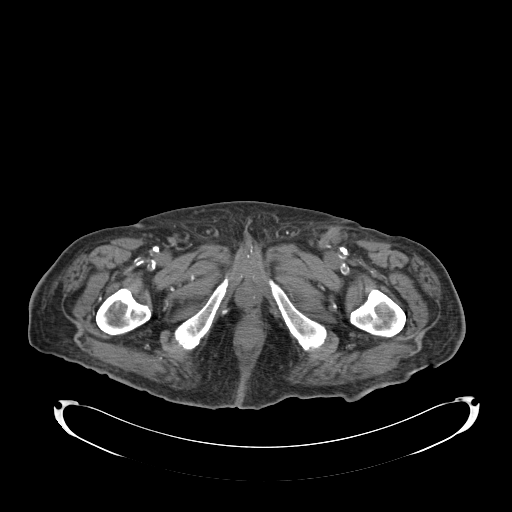
[im 4/90  bone]
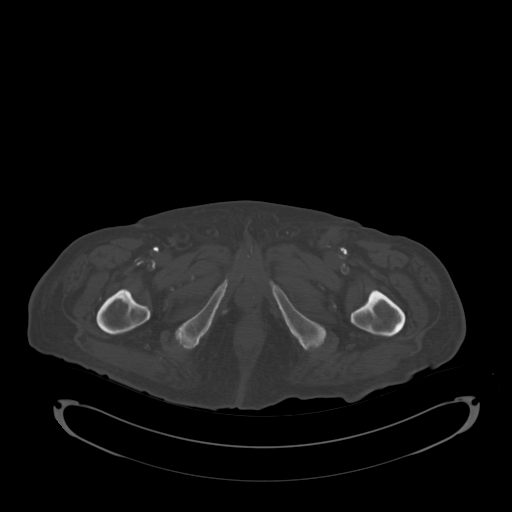
[im 12/90  soft-tissue]
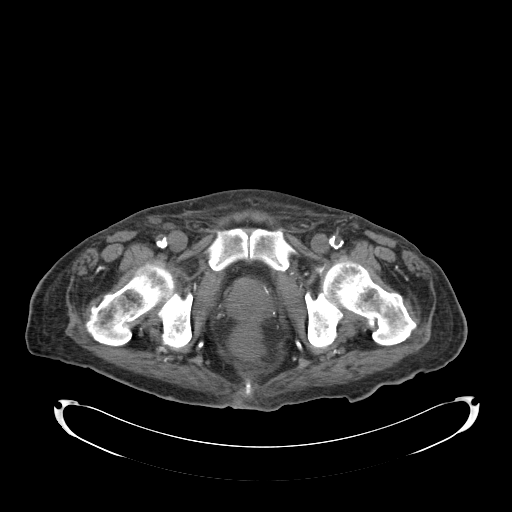
[im 19/90  soft-tissue]
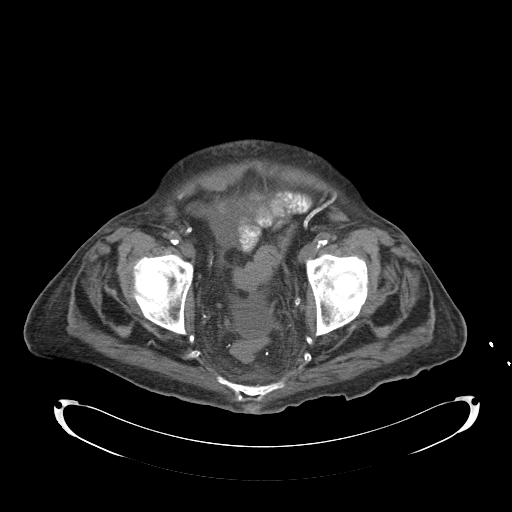
[im 23/90  soft-tissue]
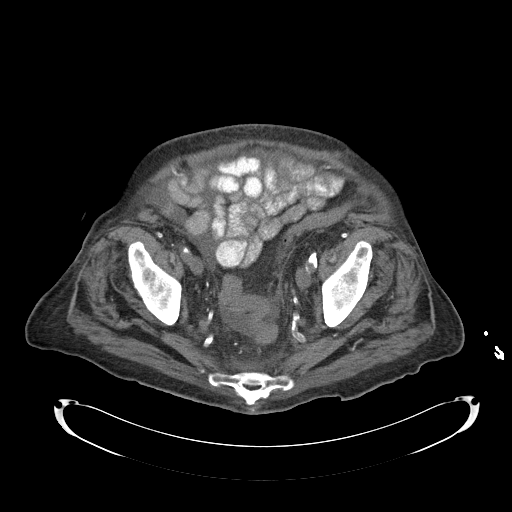
[im 30/90  soft-tissue]
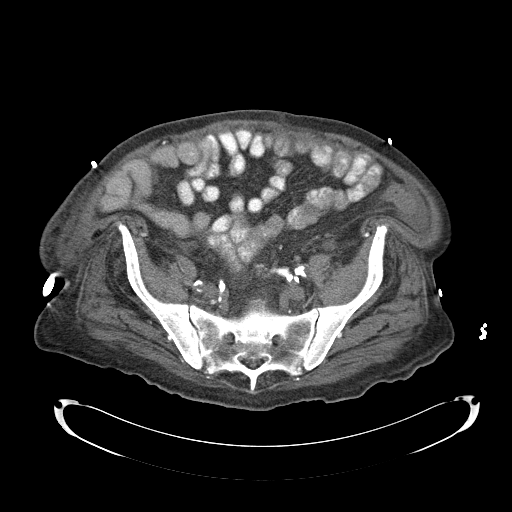
[im 38/90  soft-tissue]
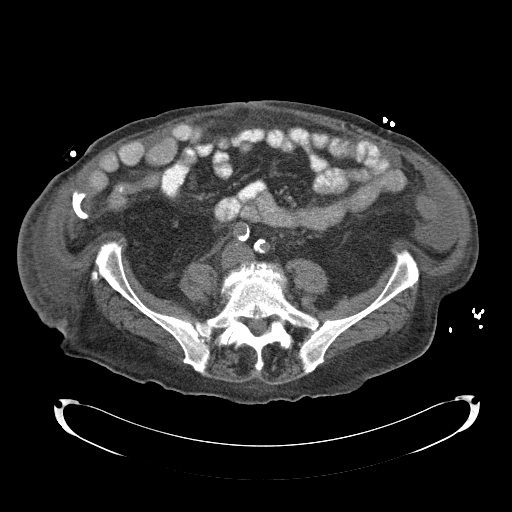
[im 41/90  soft-tissue]
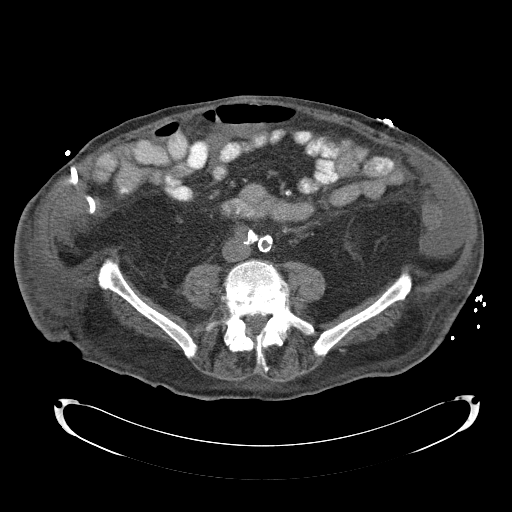
[im 49/90  soft-tissue]
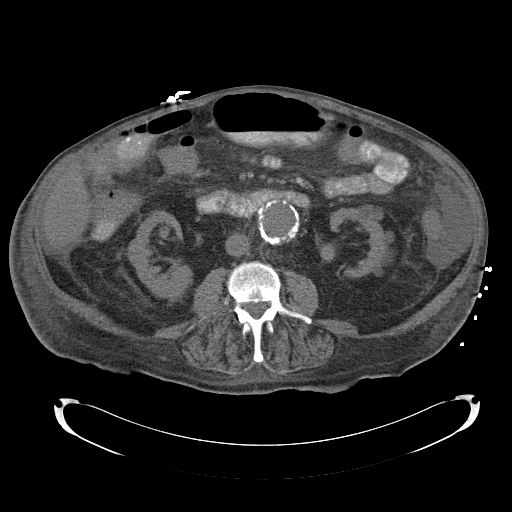
[im 52/90  soft-tissue]
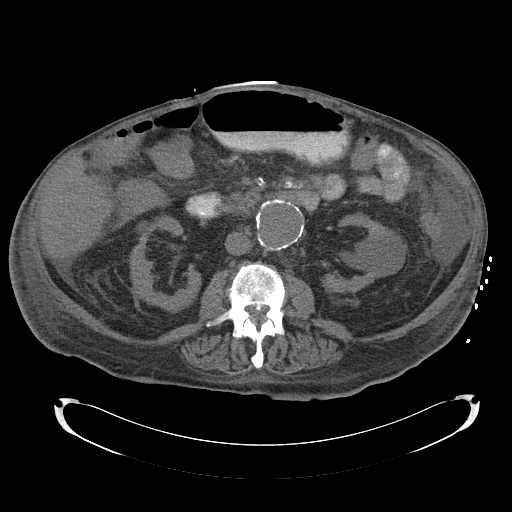
[im 52/90  bone]
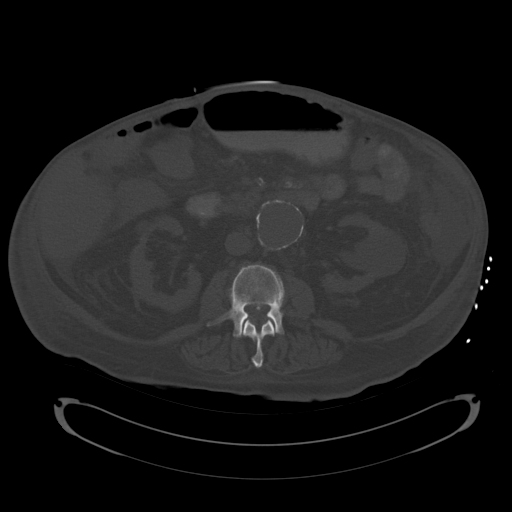
[im 60/90  soft-tissue]
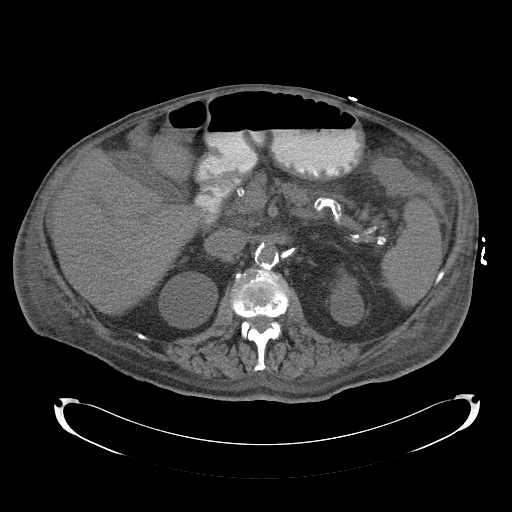
[im 67/90  soft-tissue]
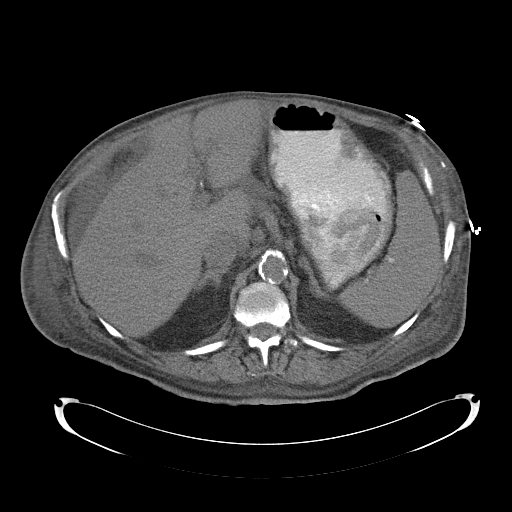
[im 71/90  soft-tissue]
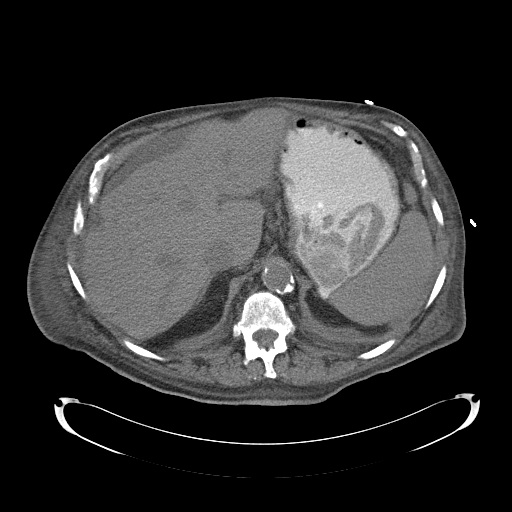
[im 78/90  soft-tissue]
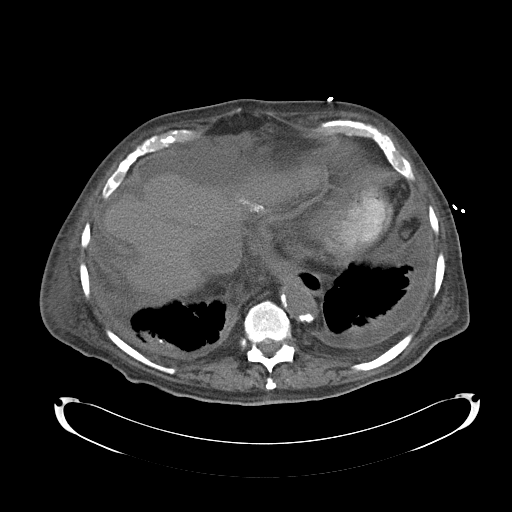
[im 86/90  soft-tissue]
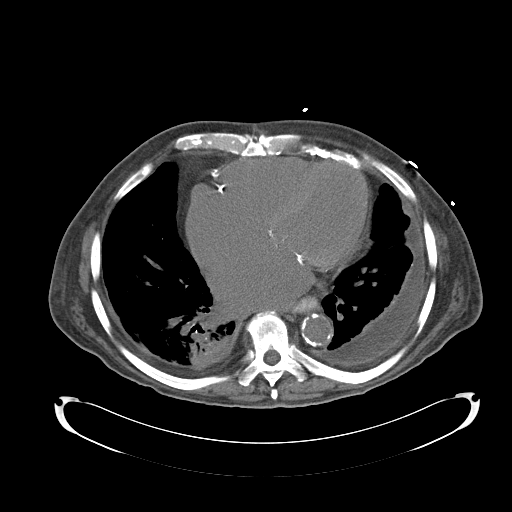

[Series 5: cor routine abd pel wo · coronal · 0.91mm/px · 3 of 151 slices shown]
[im 51/151  soft-tissue]
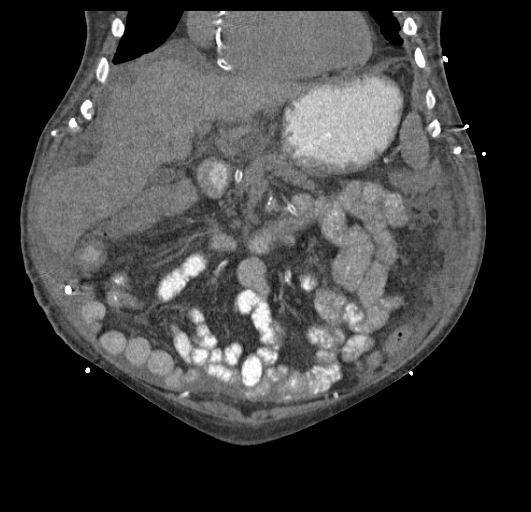
[im 67/151  soft-tissue]
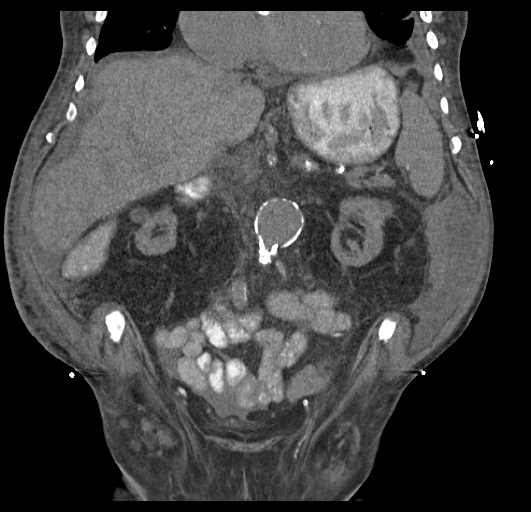
[im 84/151  soft-tissue]
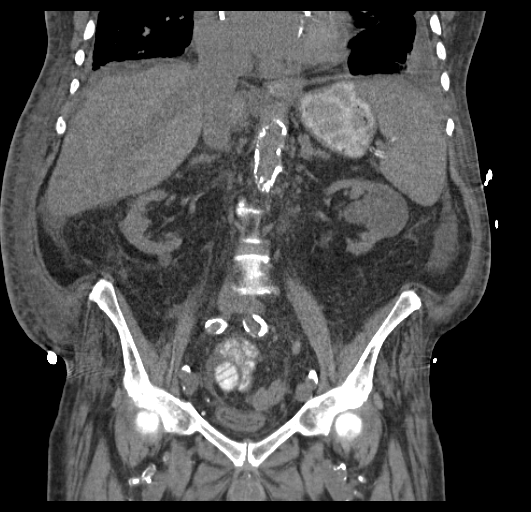

[17 of 46 positions shown; findings below may reference images not displayed]

FINDINGS: Lung bases show small bilateral effusions with pleural thickening
and rounded airspace disease in the right lower lobe. Atelectasis in
the left lower lobe. Heart is enlarged. There is decreased
attenuation of the intravascular compartment, indicative of anemia.
No pericardial effusion.

Perihepatic ascites is increased in the interval. Liver margin
appears somewhat irregular. Gallbladder and adrenal glands are
unremarkable.

Multiple low-attenuation lesions in the kidneys bilaterally,
measuring up to 5.2 cm on the left. Kidneys are somewhat atrophic.
An intermediate attenuation lesion in the lower pole right kidney
measures 1.5 cm, as before (image 44).

Spleen, pancreas, stomach and bowel are unremarkable. A pigtail
drainage catheter is seen in the right lower quadrant, inferior to
the right hepatic lobe. Scattered ascites, overall decreased.
Extensive atherosclerotic calcification of the arterial vasculature.
Infrarenal aortic aneurysm measures 4.5 x 4.7 cm. No pathologically
enlarged lymph nodes. No worrisome lytic or sclerotic lesions.
Bilateral L4 pars defects with grade 2 anterolisthesis of L4 on L5.
IMPRESSION: 1. Residual fluid about the tip of a pigtail drainage catheter
located inferior to the right hepatic lobe. A discrete measurable
fluid collection is not readily identified.
2. Ascites, slightly decreased.
3. Small chronic appearing bilateral pleural effusions, left greater
than right, with probable rounded atelectasis in the right lower
lobe.
4. Cirrhosis.
5. Infrarenal aortic aneurysm.

## 2015-01-10 IMAGING — CR DG CHEST 1V PORT
1 series · 1 of 1 positions shown · non-contrast
Comparison: Portable chest x-ray at July 03, 2013

CLINICAL DATA: Chest pain

EXAM:
PORTABLE CHEST - 1 VIEW

[ap]
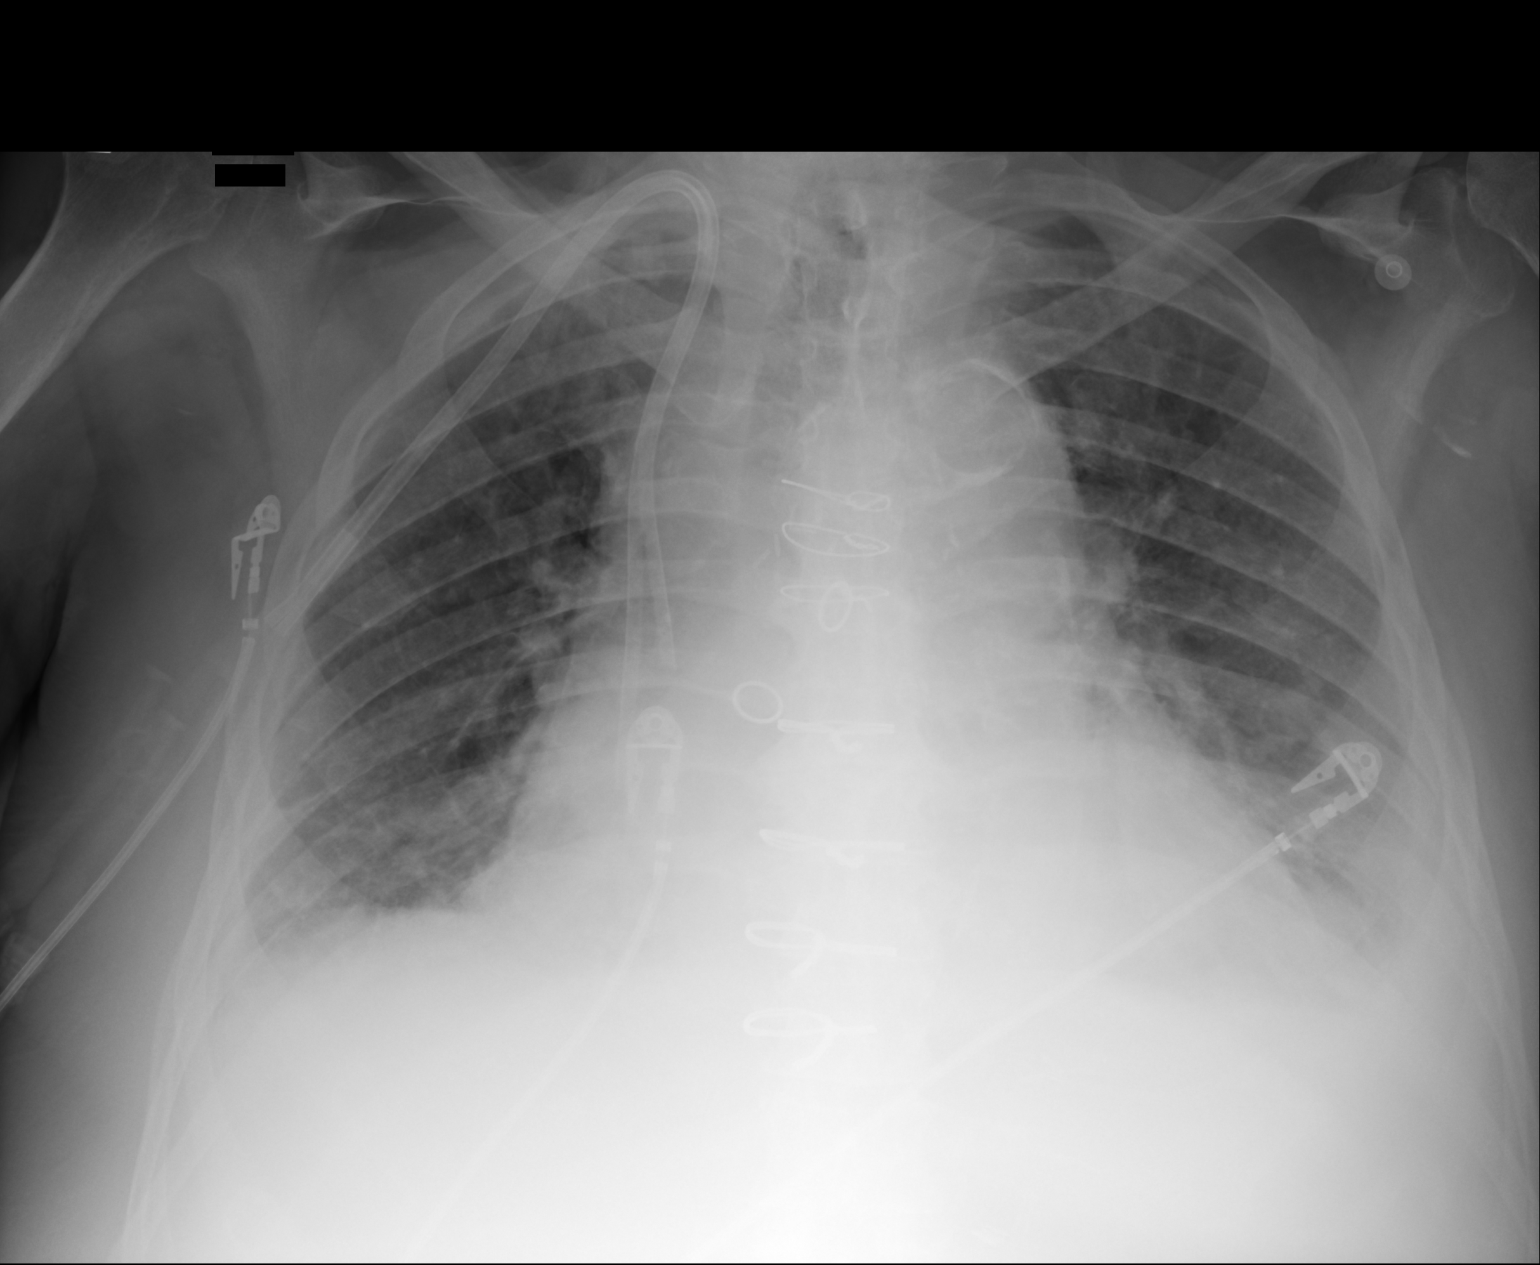

[1 of 1 positions shown; findings below may reference images not displayed]

FINDINGS: The lungs are reasonably well inflated. The left hemidiaphragm
remains partially obscured and pleural fluid blunts the costophrenic
angle. On the right the hemidiaphragm is also partially obscured but
better demonstrated than the left. This appears stable. The
cardiopericardial silhouette remains enlarged. The pulmonary
vascularity is more prominent today in the interstitial markings are
slightly more conspicuous. The patient has undergone previous CABG.
The dual-lumen dialysis type catheter in place via the right
subclavian approach is unchanged. The left internal jugular venous
catheter has been withdrawn.
IMPRESSION: Findings are consistent with low-grade CHF. The appearance of the
pulmonary interstitium has worsened since the previous study.

## 2015-01-10 IMAGING — CR DG CHEST 1V PORT
1 series · 1 of 1 positions shown · non-contrast
Comparison: 07/09/2013

CLINICAL DATA: Central line placement

EXAM:
PORTABLE CHEST - 1 VIEW

[ap]
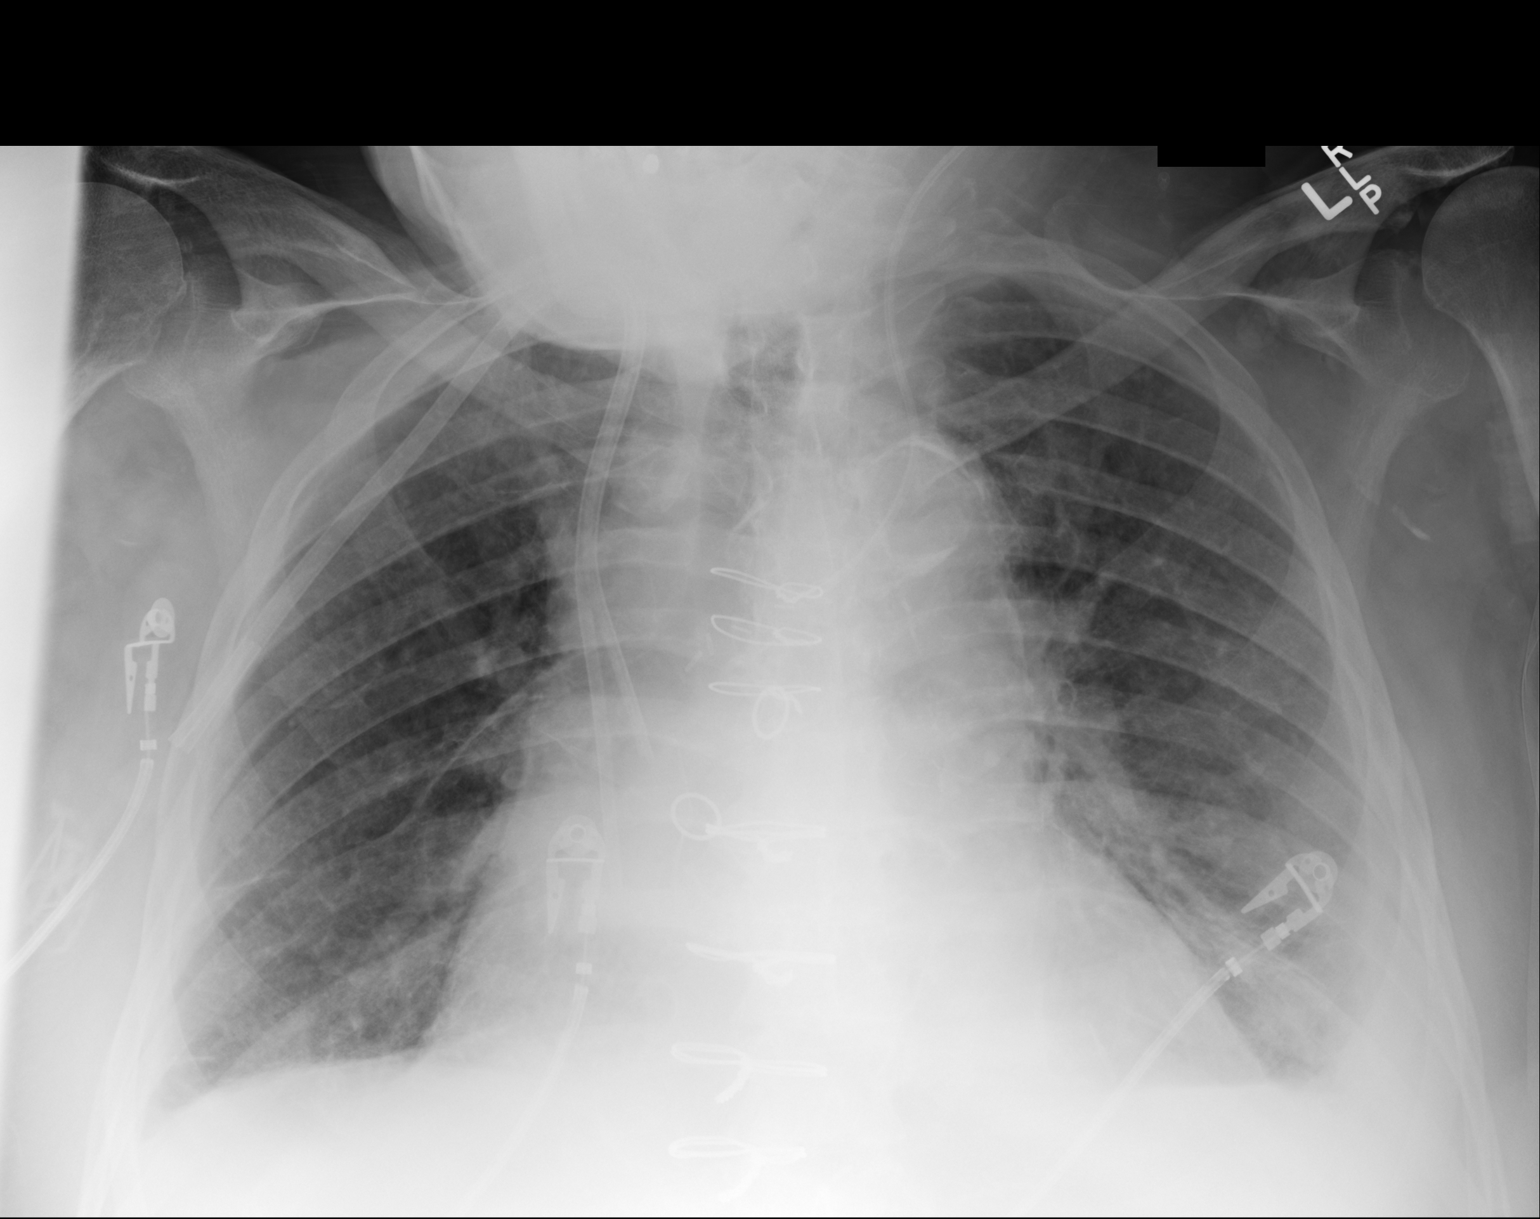

[1 of 1 positions shown; findings below may reference images not displayed]

FINDINGS: Low lung volumes. Cardiac silhouette is enlarged. Aorta is tortuous
and ectatic. Atherosclerotic calcifications identified within the
aorta. A left-sided internal jugular central venous catheter is
appreciated tip projecting region of the superior vena caval
innominate junction. A right-sided dialysis catheter is appreciated
via a right internal jugular approach with tip projecting region of
these superior vena cava/superior vena caval right atrial junction.
Patient is status post median sternotomy coronary artery bypass
grafting. There is no evidence of pneumothorax. Mild areas of
increased density project within the lung bases left greater than
right. Is blunting of left costophrenic angle. There is prominence
of the interstitial markings, mild. The osseous structures
demonstrate degenerative changes within the right shoulder.
IMPRESSION: Patient is status post left internal jugular catheter placement tip
projecting region of the innominate superior vena caval junction. No
evidence of pneumothorax. There are findings likely reflecting
underlying component of mild pulmonary edema. Asymmetric edema
versus infiltrate left lung bases well as possibly small effusion. A
right-sided dialysis catheter is also appreciated.
